# Patient Record
Sex: Male | Born: 1995 | Hispanic: No | Marital: Single | Smoking: Former smoker
Health system: Southern US, Community
[De-identification: ages and names within clinical notes are randomized; demographics above are authoritative.]

## PROBLEM LIST (undated history)

## (undated) DIAGNOSIS — J45909 Unspecified asthma, uncomplicated: Principal | ICD-10-CM

## (undated) HISTORY — DX: Unspecified asthma, uncomplicated: J45.909

---
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
## 2013-04-16 ENCOUNTER — Encounter: Payer: Self-pay | Admitting: Family Medicine

## 2013-04-16 ENCOUNTER — Ambulatory Visit (INDEPENDENT_AMBULATORY_CARE_PROVIDER_SITE_OTHER): Payer: BC Managed Care – PPO | Admitting: Family Medicine

## 2013-04-16 VITALS — BP 151/91 | HR 108 | Temp 98.2°F | Wt 318.0 lb

## 2013-04-16 DIAGNOSIS — B9689 Other specified bacterial agents as the cause of diseases classified elsewhere: Secondary | ICD-10-CM

## 2013-04-16 DIAGNOSIS — J4540 Moderate persistent asthma, uncomplicated: Secondary | ICD-10-CM | POA: Insufficient documentation

## 2013-04-16 DIAGNOSIS — J45909 Unspecified asthma, uncomplicated: Secondary | ICD-10-CM

## 2013-04-16 DIAGNOSIS — A499 Bacterial infection, unspecified: Secondary | ICD-10-CM

## 2013-04-16 DIAGNOSIS — J329 Chronic sinusitis, unspecified: Secondary | ICD-10-CM

## 2013-04-16 MED ORDER — BUDESONIDE-FORMOTEROL FUMARATE 160-4.5 MCG/ACT IN AERO: 2.0000 | INHALATION_SPRAY | Freq: Two times a day (BID) | RESPIRATORY_TRACT | Status: DC

## 2013-04-16 MED ORDER — ALBUTEROL SULFATE HFA 108 (90 BASE) MCG/ACT IN AERS: INHALATION_SPRAY | RESPIRATORY_TRACT | Status: DC

## 2013-04-16 MED ORDER — AMOXICILLIN-POT CLAVULANATE 500-125 MG PO TABS: ORAL_TABLET | ORAL | Status: AC

## 2013-04-16 NOTE — Progress Notes (Signed)
CC: Thomas Salas is a 17 y.o. male is here for Otalgia   Subjective: HPI:  Patient claims of right ear pain described as a sharpness it is nonradiating deep in the ear has been present for 2 weeks accompanied by facial pressure, runny nose, subjective postnasal drip. He believes he's had some mild hearing loss in the right side. Denies discharge from ear, dizziness, tinnitus, headache. Has had subjective fevers and chills over the past 2 days. No interventions as of yet.   He reports that he's been using an old prescription of Symbicort using about 1-2 puffs every one to 2 days. He is currently using it as a rescue inhaler along with albuterol. He reports that he is using albuterol lease once a day symptoms are worse in the evening occasionally awakening due to wheezing. Symptoms are also worse around dogs. Denies chest pain, rash, nor shortness of breath at rest.   Review Of Systems Outlined In HPI  Past Medical History  Diagnosis Date  . Unspecified asthma(493.90) 12/13/2012    Has been Rxed controlled inhaler but not using it.      History reviewed. No pertinent family history.   History  Substance Use Topics  . Smoking status: Never Smoker   . Smokeless tobacco: Not on file  . Alcohol Use: No     Objective: Filed Vitals:   04/16/13 1358  BP: 151/91  Pulse: 108  Temp: 98.2 F (36.8 C)    General: Alert and Oriented, No Acute Distress HEENT: Pupils equal, round, reactive to light. Conjunctivae clear.  External ears unremarkable, canals clear with intact TMs with appropriate landmarks. Middle ear with serous effusion on the right greater than left. Pink inferior turbinates.  Moist mucous membranes, pharynx without inflammation nor lesions.  Neck supple without palpable lymphadenopathy nor abnormal masses. Right tympanogram is essentially flat with low peak centrally, 2 cc volume in the right canal. Rinne test and Weber normal bilaterally. Lungs: Clear to auscultation  bilaterally, no wheezing/ronchi/rales.  Comfortable work of breathing. Good air movement. Extremities: No peripheral edema.  Strong peripheral pulses.  Mental Status: No depression, anxiety, nor agitation. Skin: Warm and dry.  Assessment & Plan: [name] was seen today for otalgia.  Diagnoses and associated orders for this visit:  Bacterial sinusitis - amoxicillin-clavulanate (AUGMENTIN) 500-125 MG per tablet; Take one by mouth every 8 hours for ten total days.  Asthma, moderate persistent  Other Orders - budesonide-formoterol (SYMBICORT) 160-4.5 MCG/ACT inhaler; Inhale 2 puffs into the lungs 2 (two) times daily. - albuterol (PROVENTIL HFA;VENTOLIN HFA) 108 (90 BASE) MCG/ACT inhaler; Inhale two puffs every 4-6 hours only as needed for shortness of breath or wheezing.    Suspect bacterial sinusitis is contributing to eustachian tube dysfunction causing discomfort in the right ear. Start Augmentin. If no improvement by the end of the week will refer to ENT Asthma: Uncontrolled, discussed appropriate use of Symbicort as maintenance inhaler and albuterol for rescue inhaler. Start Symbicort 2 puffs twice a day return 2- 4 weeks for asthma reevaluation.  Return if symptoms worsen or fail to improve.

## 2013-06-04 ENCOUNTER — Telehealth: Payer: Self-pay | Admitting: *Deleted

## 2013-06-04 ENCOUNTER — Encounter: Payer: Self-pay | Admitting: Family Medicine

## 2013-06-04 ENCOUNTER — Ambulatory Visit (INDEPENDENT_AMBULATORY_CARE_PROVIDER_SITE_OTHER): Payer: BC Managed Care – PPO | Admitting: Family Medicine

## 2013-06-04 VITALS — BP 127/80 | HR 86 | Temp 97.8°F | Ht 73.0 in | Wt 316.0 lb

## 2013-06-04 DIAGNOSIS — J45901 Unspecified asthma with (acute) exacerbation: Secondary | ICD-10-CM

## 2013-06-04 MED ORDER — ALBUTEROL SULFATE HFA 108 (90 BASE) MCG/ACT IN AERS: INHALATION_SPRAY | RESPIRATORY_TRACT | Status: DC

## 2013-06-04 MED ORDER — PREDNISONE 20 MG PO TABS: ORAL_TABLET | ORAL | Status: AC

## 2013-06-04 MED ORDER — DOXYCYCLINE HYCLATE 100 MG PO TABS: ORAL_TABLET | ORAL | Status: AC

## 2013-06-04 NOTE — Progress Notes (Signed)
CC: Thomas Salas is a 17 y.o. male is here for Asthma and Emesis   Subjective: HPI:  Patient complains of 2 days of worsening cough, shortness of breath, nonexertional chest tightness. Has been accompanied by subjective fever since yesterday and facial pressure above and below the eyes. Treatment has included using Symbicort twice a day and also a small blue inhaler that he's been using every 2-3 hours. He is unsure exactly what is contained in this inhaler specifically unsure whether or not this is albuterol. Was in his regular state of health prior to onset of symptoms. He does report vomiting this morning yellow fluid without coffee ground emesis. He's had decreased appetite throughout the day.  He does admit to smoking occasionally which worsened symptoms above but nothing else makes better or worse. No other interventions as of yet. Symptoms are present all throughout the day.  Denies confusion, abdominal pain, back pain, orthopnea, peripheral edema, nor motor or sensory disturbances   Review Of Systems Outlined In HPI  Past Medical History  Diagnosis Date  . Unspecified asthma(493.90) 12/13/2012    Has been Rxed controlled inhaler but not using it.      No family history on file.   History  Substance Use Topics  . Smoking status: Never Smoker   . Smokeless tobacco: Not on file  . Alcohol Use: No     Objective: Filed Vitals:   06/04/13 1440  BP: 127/80  Pulse: 86  Temp: 97.8 F (36.6 C)    General: Alert and Oriented, No Acute Distress HEENT: Pupils equal, round, reactive to light. Conjunctivae clear.  External ears unremarkable, canals clear with intact TMs with appropriate landmarks.  Middle ear appears open without effusion. Pink inferior turbinates.  Moist mucous membranes, pharynx without inflammation nor lesions however moderate cobblestoning.  Neck supple without palpable lymphadenopathy nor abnormal masses. Lungs: Mild end expiratory rhonchi and wheezing in all lung  fields, good air movement and comfortable work of breathing without signs of consolidation nor rails Cardiac: Regular rate and rhythm. Normal S1/S2.  No murmurs, rubs, nor gallops.   Abdomen: Obese and soft Extremities: No peripheral edema.  Strong peripheral pulses.  Mental Status: No depression, anxiety, nor agitation. Skin: Warm and dry.  Assessment & Plan: Thomas Salas was seen today for asthma and emesis.  Diagnoses and associated orders for this visit:  Asthma with acute exacerbation - predniSONE (DELTASONE) 20 MG tablet; Three tabs at once daily for five days. - doxycycline (VIBRA-TABS) 100 MG tablet; One by mouth twice a day for ten days. - albuterol (PROVENTIL HFA;VENTOLIN HFA) 108 (90 BASE) MCG/ACT inhaler; Inhale two puffs every 4-6 hours only as needed for shortness of breath or wheezing.  Requesting Ventolin.    Asthma exacerbation: Start doxycycline and prednisone continue Symbicort inhaler, if the "blue inhaler " is not indeed albuterol stop it and instead take up a refill of albuterol given above, 2 puffs every 4-6 hours scheduled for the next 48 hours and as needed. Signs and symptoms requring emergent/urgent reevaluation were discussed with the patient.  25 minutes spent face-to-face during visit today of which at least 50% was counseling or coordinating care regarding asthma exacerbation.   Return in about 2 weeks (around 06/18/2013) for Asthma.

## 2013-06-15 ENCOUNTER — Ambulatory Visit: Payer: BC Managed Care – PPO | Admitting: Family Medicine

## 2013-06-15 DIAGNOSIS — Z0289 Encounter for other administrative examinations: Secondary | ICD-10-CM

## 2013-08-29 ENCOUNTER — Ambulatory Visit (INDEPENDENT_AMBULATORY_CARE_PROVIDER_SITE_OTHER): Payer: BC Managed Care – PPO | Admitting: Family Medicine

## 2013-08-29 ENCOUNTER — Encounter: Payer: Self-pay | Admitting: Family Medicine

## 2013-08-29 VITALS — BP 136/89 | Temp 98.4°F | Ht 73.0 in | Wt 324.0 lb

## 2013-08-29 DIAGNOSIS — G4700 Insomnia, unspecified: Secondary | ICD-10-CM

## 2013-08-29 DIAGNOSIS — J4540 Moderate persistent asthma, uncomplicated: Secondary | ICD-10-CM

## 2013-08-29 DIAGNOSIS — J45901 Unspecified asthma with (acute) exacerbation: Secondary | ICD-10-CM

## 2013-08-29 DIAGNOSIS — J302 Other seasonal allergic rhinitis: Secondary | ICD-10-CM

## 2013-08-29 DIAGNOSIS — J45909 Unspecified asthma, uncomplicated: Secondary | ICD-10-CM

## 2013-08-29 DIAGNOSIS — J309 Allergic rhinitis, unspecified: Secondary | ICD-10-CM

## 2013-08-29 MED ORDER — PREDNISONE 20 MG PO TABS: ORAL_TABLET | ORAL | Status: AC

## 2013-08-29 MED ORDER — ZOLPIDEM TARTRATE 5 MG PO TABS: 5.0000 mg | ORAL_TABLET | Freq: Every evening | ORAL | Status: DC | PRN

## 2013-08-29 MED ORDER — BUDESONIDE-FORMOTEROL FUMARATE 160-4.5 MCG/ACT IN AERO: 2.0000 | INHALATION_SPRAY | Freq: Two times a day (BID) | RESPIRATORY_TRACT | Status: DC

## 2013-08-29 MED ORDER — ALBUTEROL SULFATE HFA 108 (90 BASE) MCG/ACT IN AERS: INHALATION_SPRAY | RESPIRATORY_TRACT | Status: DC

## 2013-08-29 NOTE — Progress Notes (Signed)
CC: Thomas Salas is a 18 y.o. male is here for asthma exacerbation?   Subjective: HPI:  Complains of wheezing and cough that has been present for the last month worsening a weekly basis. Now moderate in severity. Present all hours of the day. He is using either albuterol inhaler or nebulizer up to 4 times a day and this past week once a night. It awakens him from his sleep. Symptoms are improved from albuterol however only for a matter of hours. Has tried Zyrtec as a supplement for his asthma regimen however no improvement.  Symptoms are worse when around smoke or dogs. Denies fevers, chills, confusion, lightheadedness, blood in sputum, facial pressure, nor nasal congestion. He stopped using Symbicort a little over a month ago for reasons that are hard to explain.  Complains of difficulty sleeping even when he feels his asthma is well controlled that has been present since the first of this year. Described as difficulty falling asleep and staying asleep. Happens less than most days of the week. Over-the-counter sleep aids have been losing their effectiveness. Denies depression, anxiety, nor mental disturbance   Review Of Systems Outlined In HPI  Past Medical History  Diagnosis Date  . Unspecified asthma(493.90) 12/13/2012    Has been Rxed controlled inhaler but not using it.     No past surgical history on file. No family history on file.  History   Social History  . Marital Status: Unknown    Spouse Name: N/A    Number of Children: N/A  . Years of Education: N/A   Occupational History  . Not on file.   Social History Main Topics  . Smoking status: Never Smoker   . Smokeless tobacco: Not on file  . Alcohol Use: No  . Drug Use: No  . Sexual Activity: Not Currently   Other Topics Concern  . Not on file   Social History Narrative  . No narrative on file     Objective: BP 136/89  Temp(Src) 98.4 F (36.9 C) (Oral)  Ht 6\' 1"  (1.854 m)  Wt 324 lb (146.965 kg)  BMI 42.76  kg/m2  SpO2 95%  PF 320 L/min  General: Alert and Oriented, No Acute Distress HEENT: Pupils equal, round, reactive to light. Conjunctivae clear.  External ears unremarkable, canals clear with intact TMs with appropriate landmarks.  Middle ear appears open without effusion. Pink inferior turbinates.  Moist mucous membranes, pharynx without inflammation nor lesions.  Neck supple without palpable lymphadenopathy nor abnormal masses. Lungs: Distant breath sounds with trace end expiratory wheezing in all lung lobes. No signs of consolidation. Comfortable work of breathing speaking in long sentences without discomfort. Cardiac: Regular rate and rhythm. Normal S1/S2.  No murmurs, rubs, nor gallops.   Mental Status: No depression, anxiety, nor agitation. Skin: Warm and dry.  Assessment & Plan: Rowe was seen today for asthma exacerbation?.  Diagnoses and associated orders for this visit:  Asthma, moderate persistent - Ambulatory referral to Allergy  Asthma with acute exacerbation - albuterol (PROVENTIL HFA;VENTOLIN HFA) 108 (90 BASE) MCG/ACT inhaler; Inhale two puffs every 4-6 hours only as needed for shortness of breath or wheezing.  Requesting Ventolin. - budesonide-formoterol (SYMBICORT) 160-4.5 MCG/ACT inhaler; Inhale 2 puffs into the lungs 2 (two) times daily. - predniSONE (DELTASONE) 20 MG tablet; Three tabs daily days 1-3, two tabs daily days 4-6, one tab daily days 7-9, half tab daily days 10-13.  Seasonal allergies - Ambulatory referral to Allergy  Insomnia - zolpidem (AMBIEN) 5 MG tablet;  Take 1 tablet (5 mg total) by mouth at bedtime as needed for sleep.    Asthma exacerbation: Continue albuterol, schedule 2 puffs every 4 hours for the next 2 days. Then as needed. Start prednisone taper, samples of Symbicort were provided along with a new prescription. Encouraged use of Symbicort on a daily basis twice a day and washing mouth out afterwards even if he is feeling no asthma symptoms  whatsoever.  He has been lost to follow up to his allergist therefore rereferral has been placed Insomnia: Start Ambien however not until asthma is much better controlled to avoid nocturnal hypoxemia   Return in about 4 weeks (around 09/26/2013).

## 2013-12-13 ENCOUNTER — Encounter: Payer: Self-pay | Admitting: Emergency Medicine

## 2013-12-13 ENCOUNTER — Emergency Department
Admission: EM | Admit: 2013-12-13 | Discharge: 2013-12-13 | Disposition: A | Discharge status: Home / Self Care | Payer: BC Managed Care – PPO | Source: Home / Self Care | Attending: Emergency Medicine | Admitting: Emergency Medicine

## 2013-12-13 DIAGNOSIS — J029 Acute pharyngitis, unspecified: Secondary | ICD-10-CM

## 2013-12-13 DIAGNOSIS — H65199 Other acute nonsuppurative otitis media, unspecified ear: Secondary | ICD-10-CM

## 2013-12-13 DIAGNOSIS — J209 Acute bronchitis, unspecified: Secondary | ICD-10-CM

## 2013-12-13 DIAGNOSIS — H65192 Other acute nonsuppurative otitis media, left ear: Secondary | ICD-10-CM

## 2013-12-13 DIAGNOSIS — J0100 Acute maxillary sinusitis, unspecified: Secondary | ICD-10-CM

## 2013-12-13 LAB — POCT INFLUENZA A/B
Influenza A, POC: NEGATIVE
Influenza B, POC: NEGATIVE

## 2013-12-13 LAB — POCT RAPID STREP A (OFFICE): Rapid Strep A Screen: NEGATIVE

## 2013-12-13 MED ORDER — PROMETHAZINE-CODEINE 6.25-10 MG/5ML PO SYRP: ORAL_SOLUTION | ORAL | Status: DC

## 2013-12-13 MED ORDER — ALBUTEROL SULFATE HFA 108 (90 BASE) MCG/ACT IN AERS: 2.0000 | INHALATION_SPRAY | RESPIRATORY_TRACT | Status: DC | PRN

## 2013-12-13 MED ORDER — AMOXICILLIN-POT CLAVULANATE 875-125 MG PO TABS: 1.0000 | ORAL_TABLET | Freq: Two times a day (BID) | ORAL | Status: DC

## 2013-12-13 MED ORDER — CEFTRIAXONE SODIUM 1 G IJ SOLR
1.0000 g | INTRAMUSCULAR | Status: AC
  Administered 2013-12-13: 1 g via INTRAMUSCULAR

## 2013-12-13 MED ORDER — IPRATROPIUM-ALBUTEROL 0.5-2.5 (3) MG/3ML IN SOLN
3.0000 mL | Freq: Once | RESPIRATORY_TRACT | Status: AC
  Administered 2013-12-13: 3 mL via RESPIRATORY_TRACT

## 2013-12-13 MED ORDER — BUDESONIDE-FORMOTEROL FUMARATE 160-4.5 MCG/ACT IN AERO: 2.0000 | INHALATION_SPRAY | Freq: Two times a day (BID) | RESPIRATORY_TRACT | Status: DC

## 2013-12-13 MED ORDER — PREDNISONE (PAK) 10 MG PO TABS: ORAL_TABLET | ORAL | Status: DC

## 2013-12-13 MED ORDER — METHYLPREDNISOLONE ACETATE 80 MG/ML IJ SUSP
80.0000 mg | Freq: Once | INTRAMUSCULAR | Status: AC
  Administered 2013-12-13: 80 mg via INTRAMUSCULAR

## 2013-12-13 NOTE — ED Notes (Signed)
Reports just returning from cruise to [place] and has had general body aches and congestion, cough, headache, sore throat and a sense of fever for 3 days. No OTCs today.

## 2013-12-13 NOTE — ED Provider Notes (Signed)
CSN: [id number]     Arrival date & time 12/13/13  1446 History   First MD Initiated Contact with Patient 12/13/13 1503     Chief Complaint  Patient presents with  . Generalized Body Aches  . Headache  . Otalgia  . Nasal Congestion  . Cough  . Sore Throat    HPI Reports just returning from cruise to [place] and has had general body aches and congestion, cough, headache, sore throat and chest congestion, wheezing, fever, chills for 3 days.--Symptoms worsening today.   No OTCs today. He tried his albuterol inhaler couple hours ago without significant improvement.   + chills/sweats +  Fever  +  Nasal congestion +  Discolored Post-nasal drainage No sinus pain/pressure + sore throat  +  Cough, occasionally productive + wheezing + chest congestion No hemoptysis + shortness of breath No pleuritic pain No chest pain  No itchy/red eyes + earache L>R  No nausea No vomiting No abdominal pain No diarrhea  No skin rashes. No history of tick bite. +  Fatigue + myalgias + mild nonfocal headache .  No focal neurologic symptoms. No syncope or lightheadedness. Past Medical History  Diagnosis Date  . Unspecified asthma(493.90) 12/13/2012    Has been Rxed controlled inhaler but not using it.    History reviewed. No pertinent past surgical history. History reviewed. No pertinent family history. History  Substance Use Topics  . Smoking status: Never Smoker   . Smokeless tobacco: Not on file  . Alcohol Use: No    Review of Systems  All other systems reviewed and are negative.   Allergies  Review of patient's allergies indicates no known allergies.  Home Medications   Prior to Admission medications   Medication Sig Start Date End Date Taking? Authorizing Provider  albuterol (PROVENTIL HFA;VENTOLIN HFA) 108 (90 BASE) MCG/ACT inhaler Inhale 2 puffs into the lungs every 4 (four) hours as needed for wheezing or shortness of breath. 12/13/13   Lajean Manes, MD   amoxicillin-clavulanate (AUGMENTIN) 875-125 MG per tablet Take 1 tablet by mouth every 12 (twelve) hours. Take for 10 days. Take with food. 12/13/13   Lajean Manes, MD  budesonide-formoterol [facility]) 160-4.5 MCG/ACT inhaler Inhale 2 puffs into the lungs 2 (two) times daily. 12/13/13   Lajean Manes, MD  predniSONE (STERAPRED UNI-PAK) 10 MG tablet Take as directed for 6 days.--Take 6 on day 1, 5 on day 2, 4 on day 3, then 3 tablets on day 4, then 2 tablets on day 5, then 1 on day 6. 12/13/13   Lajean Manes, MD  promethazine-codeine [facility] WITH CODEINE) 6.25-10 MG/5ML syrup Take 1-2 teaspoons every 4-6 hours as needed for cough. May cause drowsiness. 12/13/13   Lajean Manes, MD  zolpidem (AMBIEN) 5 MG tablet Take 1 tablet (5 mg total) by mouth at bedtime as needed for sleep. 08/29/13   Sean Hommel, DO   BP 128/84  Pulse 114  Temp(Src) 98.6 F (37 C) (Oral)  Resp 16  Ht 6\' 1"  (1.854 m)  Wt 330 lb (149.687 kg)  BMI 43.55 kg/m2  SpO2 96% Physical Exam  Nursing note and vitals reviewed. Constitutional: He is oriented to person, place, and time. He appears well-developed and well-nourished. No distress.  Fatigued, alert, cooperative. Appears very ill but not toxic. Occasional hacking cough noted.  HENT:  Head: Normocephalic and atraumatic.  Right Ear: Tympanic membrane, external ear and ear canal normal. No drainage or tenderness.  Left Ear: Ear canal normal. No drainage or tenderness. A  middle ear effusion is present.  Nose: Mucosal edema and rhinorrhea present. Right sinus exhibits maxillary sinus tenderness. Left sinus exhibits maxillary sinus tenderness.  Mouth/Throat: No oral lesions. No oropharyngeal exudate.  Pharynx: One plus red tonsils without exudate. Airway intact. Left TM: Moderately red, mildly bulging and distorted. No perforation. No drainage.  Eyes: Right eye exhibits no discharge. Left eye exhibits no discharge. No scleral icterus.  Neck: Neck supple. No JVD present. No  tracheal deviation present.  Cardiovascular: Normal rate, regular rhythm and normal heart sounds.   Pulmonary/Chest: Effort normal. No stridor. He has wheezes. He has rhonchi. He has no rales.  Diffuse late expiratory wheezing and rhonchi . No rales. Equal expansion . Pulse ox 96% room air  Abdominal: Soft. There is no tenderness.  Lymphadenopathy:    He has no cervical adenopathy.  Neurological: He is alert and oriented to person, place, and time. No cranial nerve deficit. He exhibits normal muscle tone. Coordination normal.  Skin: Skin is warm and dry. No rash noted.  Psychiatric: He has a normal mood and affect.    ED Course  Procedures (including critical care time) Labs Review Labs Reviewed  POCT INFLUENZA A/B  POCT RAPID STREP A (OFFICE)   Results for orders placed during the hospital encounter of 12/13/13  POCT INFLUENZA A/B      Result Value Ref Range   Influenza A, POC Negative     Influenza B, POC Negative    POCT RAPID STREP A (OFFICE)      Result Value Ref Range   Rapid Strep A Screen Negative  Negative     Imaging Review No results found.   MDM   1. Bronchitis, acute, with bronchospasm   2. Acute pharyngitis, unspecified pharyngitis type   3. Acute maxillary sinusitis, recurrence not specified   4. Acute nonsuppurative otitis media of left ear    Rapid strep test negative. Rapid tests for influenza negative .  Treatment options discussed, as well as risks, benefits, alternatives. Patient voiced understanding and agreement with the following plans:  DuoNeb nebulizer treatment given. Wheezing improved. Still had mild late expiratory wheezes.--Improved aeration. Pulse ox on room air improved to 97%. Pulse 96, Regular.  Antibiotic coverage:  Rocephin 1 g IM stat Augmentin 875 twice a day x10 days  Depo-Medrol 80 mg IM stat Prednisone 10 mg-6 day Dosepak  I refilled Symbicort, 2 inhalations twice a day. He admits that he had run out and was not using  regularly. I encouraged him to use this regularly. Refill albuterol HFA rescue inhaler.  Promethazine with codeine, 4 ounces. No refills. One or 2 teaspoons every 6 hours when necessary cough, but precautions discussed.  Other symptomatic care discussed. Explained the importance of controlling his asthma and following up regularly with PCP.  Follow-up with your primary care doctor in 7 days , or sooner if symptoms become worse. Precautions discussed. Red flags discussed. Questions invited and answered. Patient voiced understanding and agreement.      Lajean Manesavid Massey, MD 12/13/13 [phone]

## 2014-04-24 ENCOUNTER — Telehealth: Payer: Self-pay | Admitting: *Deleted

## 2014-04-24 MED ORDER — ALBUTEROL SULFATE HFA 108 (90 BASE) MCG/ACT IN AERS: 2.0000 | INHALATION_SPRAY | RESPIRATORY_TRACT | Status: DC | PRN

## 2014-04-24 NOTE — Telephone Encounter (Signed)
Pt called in for refill of albuterol. I checked his last OV and informed him that he was to have f/u W/ Dr. Ivan AnchorsHommel on 09/26/13 for asthma. I told him that I would send a refill for his albuterol and stressed the importance of him following up with Dr. Ivan AnchorsHommel. He is off on Friday appt made for 04/26/14 @815 . He voiced understanding .Loralee PacasBarkley, Vennela Jutte [place]\

## 2014-04-26 ENCOUNTER — Ambulatory Visit (INDEPENDENT_AMBULATORY_CARE_PROVIDER_SITE_OTHER): Payer: BC Managed Care – PPO | Admitting: Family Medicine

## 2014-04-26 ENCOUNTER — Encounter: Payer: Self-pay | Admitting: Family Medicine

## 2014-04-26 VITALS — BP 132/76 | HR 61 | Ht 74.0 in | Wt 341.0 lb

## 2014-04-26 DIAGNOSIS — J4540 Moderate persistent asthma, uncomplicated: Secondary | ICD-10-CM | POA: Diagnosis not present

## 2014-04-26 MED ORDER — BUDESONIDE-FORMOTEROL FUMARATE 160-4.5 MCG/ACT IN AERO: 2.0000 | INHALATION_SPRAY | Freq: Two times a day (BID) | RESPIRATORY_TRACT | Status: DC

## 2014-04-26 MED ORDER — MONTELUKAST SODIUM 10 MG PO TABS: 10.0000 mg | ORAL_TABLET | Freq: Every day | ORAL | Status: DC

## 2014-04-26 MED ORDER — ALBUTEROL SULFATE HFA 108 (90 BASE) MCG/ACT IN AERS: 2.0000 | INHALATION_SPRAY | RESPIRATORY_TRACT | Status: DC | PRN

## 2014-04-26 NOTE — Progress Notes (Signed)
CC: Thomas Salas is a 18 y.o. male is here for Follow-up   Subjective: HPI:   Has been using albuterol pretty much every day of the week. Symptoms are worse first thing in the morning. It provides temporary relief of moderate wheezing and shortness of breath. He's been using a dry powder form of this but feels that the aerosol based his most effective. He's also been using Symbicort twice a day most days of the week. He is trying to stretch out his supply of Symbicort and has been using family members supplies. Symptoms are worse around dust and when outside. He feels like shortness of breath and wheezing has been worsening over the past 1-2 weeks. He denies fevers, chills, chest pain, motor or sensory disturbances, sore throat, postnasal drip, nor nasal congestion.   Review Of Systems Outlined In HPI  Past Medical History  Diagnosis Date  . Unspecified asthma(493.90) 12/13/2012    Has been Rxed controlled inhaler but not using it.     No past surgical history on file. No family history on file.  History   Social History  . Marital Status: Unknown    Spouse Name: N/A    Number of Children: N/A  . Years of Education: N/A   Occupational History  . Not on file.   Social History Main Topics  . Smoking status: Never Smoker   . Smokeless tobacco: Not on file  . Alcohol Use: No  . Drug Use: No  . Sexual Activity: Not Currently   Other Topics Concern  . Not on file   Social History Narrative     Objective: BP 132/76 mmHg  Pulse 61  Ht 6\' 2"  (1.88 m)  Wt 341 lb (154.677 kg)  BMI 43.76 kg/m2  General: Alert and Oriented, No Acute Distress HEENT: Pupils equal, round, reactive to light. Conjunctivae clear.  External ears unremarkable, canals clear with intact TMs with appropriate landmarks.  Middle ear appears open without effusion. Pink inferior turbinates.  Moist mucous membranes, pharynx without inflammation nor lesions.  Neck supple without palpable lymphadenopathy nor  abnormal masses. Lungs: comfortable work of breathing with mild and expert for a wheezing in all lung fields without rhonchi nor rales. Cardiac: Regular rate and rhythm. Normal S1/S2.  No murmurs, rubs, nor gallops.   Extremities: No peripheral edema.  Strong peripheral pulses.  Mental Status: No depression, anxiety, nor agitation. Skin: Warm and dry.  Assessment & Plan: [name] was seen today for follow-up.  Diagnoses and associated orders for this visit:  Asthma, moderate persistent, uncomplicated - montelukast (SINGULAIR) 10 MG tablet; Take 1 tablet (10 mg total) by mouth at bedtime. - budesonide-formoterol (SYMBICORT) 160-4.5 MCG/ACT inhaler; Inhale 2 puffs into the lungs 2 (two) times daily. - albuterol (PROVENTIL HFA;VENTOLIN HFA) 108 (90 BASE) MCG/ACT inhaler; Inhale 2 puffs into the lungs every 4 (four) hours as needed for wheezing or shortness of breath.  Other Orders - Discontinue: budesonide-formoterol (SYMBICORT) 160-4.5 MCG/ACT inhaler; Inhale 2 puffs into the lungs 2 (two) times daily.    Asthma: Uncontrolled chronic condition, refills provided for Symbicort and albuterol. I've asked him to restart Singulair since this provided considerable benefit in the past. Time was taken to counsel on tobacco cessation.  25 minutes spent face-to-face during visit today of which at least 50% was counseling or coordinating care regarding: 1. Asthma, moderate persistent, uncomplicated      Return in about 4 weeks (around 05/24/2014) for Asthma Recheck.

## 2014-06-14 ENCOUNTER — Ambulatory Visit (INDEPENDENT_AMBULATORY_CARE_PROVIDER_SITE_OTHER): Payer: BLUE CROSS/BLUE SHIELD | Admitting: Physician Assistant

## 2014-06-14 ENCOUNTER — Encounter: Payer: Self-pay | Admitting: Physician Assistant

## 2014-06-14 VITALS — BP 137/84 | HR 88 | Ht 74.0 in | Wt 330.0 lb

## 2014-06-14 DIAGNOSIS — H66001 Acute suppurative otitis media without spontaneous rupture of ear drum, right ear: Secondary | ICD-10-CM

## 2014-06-14 DIAGNOSIS — J209 Acute bronchitis, unspecified: Secondary | ICD-10-CM | POA: Diagnosis not present

## 2014-06-14 DIAGNOSIS — J45901 Unspecified asthma with (acute) exacerbation: Secondary | ICD-10-CM

## 2014-06-14 MED ORDER — HYDROCODONE-HOMATROPINE 5-1.5 MG/5ML PO SYRP: 5.0000 mL | ORAL_SOLUTION | Freq: Every evening | ORAL | Status: DC | PRN

## 2014-06-14 MED ORDER — PREDNISONE 50 MG PO TABS: ORAL_TABLET | ORAL | Status: DC

## 2014-06-14 MED ORDER — AZITHROMYCIN 250 MG PO TABS
ORAL_TABLET | ORAL | Status: DC
Start: 2014-06-14 — End: 2014-09-02

## 2014-06-14 NOTE — Patient Instructions (Signed)

## 2014-06-14 NOTE — Progress Notes (Signed)
   Subjective:    Patient ID: Thomas Salas, male    DOB: 07/13/1995, 19 y.o.   MRN: [id number]  HPI  Pt presents to the clinic with 5 days of cough, wheezing, ST, headache, right ear pain and body aches. No fever. Cough is productive with yellow/green sputum. Hx of asthma. Wheezing is worsening using albuterol inhaler 3-4 times a day. theraflu not helping. Hot tea does soothe the throat.     Review of Systems  All other systems reviewed and are negative.      Objective:   Physical Exam  Constitutional: He is oriented to person, place, and time. He appears well-developed and well-nourished.  HENT:  Head: Normocephalic and atraumatic.  Left Ear: External ear normal.  Right TM serous, erythematous dull. No active drainage or perforation.   Oropharynx erythematous with no tonsillar swelling or exudate.   Nasal turbinates red and swollen.   Eyes: Conjunctivae are normal. Right eye exhibits no discharge. Left eye exhibits no discharge.  Neck: Normal range of motion. Neck supple.  Cardiovascular: Normal rate, regular rhythm and normal heart sounds.   Pulmonary/Chest:  Expiratory wheezing, bilaterally.    Lymphadenopathy:    He has no cervical adenopathy.  Neurological: He is alert and oriented to person, place, and time.  Skin: Skin is dry.  Psychiatric: He has a normal mood and affect. His behavior is normal.          Assessment & Plan:  Right otitis media/acute bronchitis with asthma exacerbation- zpak given today with prednisone for 5 days. Hycodan for cough only at bedtime. Encouraged to continue to use rescue inhaler as needed. Continue ibuprofen and theraflu. Rest and hydrate. Call if worsening or symptoms changing.   Pt given sample of symbicort at his request. He is using bid.

## 2014-06-19 ENCOUNTER — Telehealth: Payer: Self-pay

## 2014-06-19 MED ORDER — HYDROCODONE-HOMATROPINE 5-1.5 MG/5ML PO SYRP: 5.0000 mL | ORAL_SOLUTION | Freq: Every evening | ORAL | Status: DC | PRN
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
## 2014-06-19 NOTE — Telephone Encounter (Signed)
Patient advised to pick up  

## 2014-06-19 NOTE — Telephone Encounter (Signed)
Ok for refill hycodan. If not improving needs to come in for recheck

## 2014-09-02 ENCOUNTER — Emergency Department
Admission: EM | Admit: 2014-09-02 | Discharge: 2014-09-02 | Disposition: A | Discharge status: Home / Self Care | Payer: BLUE CROSS/BLUE SHIELD | Source: Home / Self Care | Attending: Family Medicine | Admitting: Family Medicine

## 2014-09-02 ENCOUNTER — Encounter: Payer: Self-pay | Admitting: *Deleted

## 2014-09-02 ENCOUNTER — Ambulatory Visit: Payer: Self-pay | Admitting: Family Medicine

## 2014-09-02 DIAGNOSIS — J209 Acute bronchitis, unspecified: Secondary | ICD-10-CM

## 2014-09-02 MED ORDER — GUAIFENESIN-CODEINE 100-10 MG/5ML PO SOLN: ORAL | Status: DC

## 2014-09-02 MED ORDER — AZITHROMYCIN 250 MG PO TABS: ORAL_TABLET | ORAL | Status: DC

## 2014-09-02 MED ORDER — PREDNISONE 50 MG PO TABS
ORAL_TABLET | ORAL | Status: DC
Start: 2014-09-02 — End: 2014-10-11

## 2014-09-02 MED ORDER — BENZONATATE 200 MG PO CAPS: 200.0000 mg | ORAL_CAPSULE | Freq: Every day | ORAL | Status: DC

## 2014-09-02 NOTE — Discharge Instructions (Signed)
Take plain guaifenesin (1200mg  extended release tabs such as Mucinex) twice daily, with plenty of water, for cough and congestion.  May add Pseudoephedrine (30mg , one or two every 4 to 6 hours) for sinus congestion.  Get adequate rest.   May use Afrin nasal spray (or generic oxymetazoline) twice daily for about 5 days.  Also recommend using saline nasal spray several times daily and saline nasal irrigation (AYR is a common brand).  Try warm salt water gargles for sore throat.  Stop all antihistamines for now, and other non-prescription cough/cold preparations. Continue albuterol inhaler and albuterol by nebulizer as needed.  Continue Symbicort. Follow-up with family doctor if not improving about one week.

## 2014-09-02 NOTE — ED Notes (Signed)
Thomas LemmingVictor c/o 3 days of cough, congestion and sweats. Also c/o neck and back pain.

## 2014-09-02 NOTE — ED Provider Notes (Signed)
CSN: [id number]     Arrival date & time 09/02/14  1435 History   First MD Initiated Contact with Patient 09/02/14 1524     Chief Complaint  Patient presents with  . Cough  . Night Sweats      HPI Comments: Patient complains of three day history of typical cold-like symptoms including mild sore throat, sinus congestion, headache, fatigue, chills/sweats, and cough.  He has a history of asthma, controlled with Symbicort BID, and albuterol by nebulizer PRN.  Over the past two days he has developed increased wheezing and shortness of breath with activity.  The history is provided by the patient.    Past Medical History  Diagnosis Date  . Unspecified asthma(493.90) 12/13/2012    Has been Rxed controlled inhaler but not using it.    History reviewed. No pertinent past surgical history. History reviewed. No pertinent family history. History  Substance Use Topics  . Smoking status: Never Smoker   . Smokeless tobacco: Never Used  . Alcohol Use: No    Review of Systems No sore throat + cough No pleuritic pain + wheezing + nasal congestion + post-nasal drainage No sinus pain/pressure No itchy/red eyes No earache No hemoptysis + SOB with activitiy No fever, + chills/sweats No nausea No vomiting No abdominal pain No diarrhea No urinary symptoms No skin rash + fatigue + myalgias + headache Used OTC meds without relief  Allergies  Review of patient's allergies indicates no known allergies.  Home Medications   Prior to Admission medications   Medication Sig Start Date End Date Taking? Authorizing Provider  albuterol (PROVENTIL HFA;VENTOLIN HFA) 108 (90 BASE) MCG/ACT inhaler Inhale 2 puffs into the lungs every 4 (four) hours as needed for wheezing or shortness of breath. 04/26/14   Sean Hommel, DO  azithromycin (ZITHROMAX Z-PAK) 250 MG tablet Take 2 tabs today; then begin one tab once daily for 4 more days. 09/02/14   Lattie Haw, MD  benzonatate (TESSALON) 200 MG capsule  Take 1 capsule (200 mg total) by mouth at bedtime. Take as needed for cough 09/02/14   Lattie Haw, MD  budesonide-formoterol [facility]) 160-4.5 MCG/ACT inhaler Inhale 2 puffs into the lungs 2 (two) times daily. 04/26/14   Sean Hommel, DO  montelukast (SINGULAIR) 10 MG tablet Take 1 tablet (10 mg total) by mouth at bedtime. 04/26/14   Laren Boom, DO  predniSONE (DELTASONE) 50 MG tablet Take one tab by mouth with food once daily for five days 09/02/14   Lattie Haw, MD  zolpidem (AMBIEN) 5 MG tablet Take 1 tablet (5 mg total) by mouth at bedtime as needed for sleep. 08/29/13   Sean Hommel, DO   BP 141/82 mmHg  Pulse 90  Temp(Src) 98.1 F (36.7 C) (Oral)  Resp 18  Wt 336 lb (152.409 kg)  SpO2 96% Physical Exam Nursing notes and Vital Signs reviewed. Appearance:  Patient appears stated age, and in no acute distress Eyes:  Pupils are equal, round, and reactive to light and accomodation.  Extraocular movement is intact.  Conjunctivae are not inflamed  Ears:  Canals normal.  Tympanic membranes normal.  Nose:  Mildly congested turbinates.  No sinus tenderness.   Pharynx:  Normal Neck:  Supple.  Slightly tender posterior nodes are palpated bilaterally  Lungs:  Clear to auscultation.  Breath sounds are equal.  Heart:  Regular rate and rhythm without murmurs, rubs, or gallops.  Abdomen:  Nontender without masses or hepatosplenomegaly.  Bowel sounds are present.  No CVA  or flank tenderness.  Extremities:  No edema.  No calf tenderness Skin:  No rash present.   ED Course  Procedures  none  MDM   1. Bronchitis, acute, with bronchospasm    Begin prednisone burst, and z-pack for atypical coverage.  Prescription written for Benzonatate [facility](Tessalon) to take at bedtime for night-time cough.  Take plain guaifenesin (1200mg  extended release tabs such as Mucinex) twice daily, with plenty of water, for cough and congestion.  May add Pseudoephedrine (30mg , one or two every 4 to 6 hours) for sinus  congestion.  Get adequate rest.   May use Afrin nasal spray (or generic oxymetazoline) twice daily for about 5 days.  Also recommend using saline nasal spray several times daily and saline nasal irrigation (AYR is a common brand).  Try warm salt water gargles for sore throat.  Stop all antihistamines for now, and other non-prescription cough/cold preparations. Continue albuterol inhaler and albuterol by nebulizer as needed.  Continue Symbicort. Follow-up with family doctor if not improving about one week.    Lattie HawStephen A Beese, MD 09/07/14 [phone]

## 2014-10-11 ENCOUNTER — Encounter: Payer: Self-pay | Admitting: Family Medicine

## 2014-10-11 ENCOUNTER — Ambulatory Visit (INDEPENDENT_AMBULATORY_CARE_PROVIDER_SITE_OTHER): Payer: BLUE CROSS/BLUE SHIELD | Admitting: Family Medicine

## 2014-10-11 VITALS — BP 134/91 | HR 93 | Temp 97.4°F | Wt 343.0 lb

## 2014-10-11 DIAGNOSIS — J4540 Moderate persistent asthma, uncomplicated: Secondary | ICD-10-CM

## 2014-10-11 DIAGNOSIS — M542 Cervicalgia: Secondary | ICD-10-CM

## 2014-10-11 MED ORDER — MONTELUKAST SODIUM 10 MG PO TABS: 10.0000 mg | ORAL_TABLET | Freq: Every day | ORAL | Status: DC

## 2014-10-11 MED ORDER — FLUTICASONE FUROATE-VILANTEROL 100-25 MCG/INH IN AEPB
INHALATION_SPRAY | RESPIRATORY_TRACT | Status: DC
Start: 2014-10-11 — End: 2015-04-14

## 2014-10-11 MED ORDER — METHOCARBAMOL 500 MG PO TABS: 500.0000 mg | ORAL_TABLET | Freq: Four times a day (QID) | ORAL | Status: DC | PRN

## 2014-10-11 NOTE — Progress Notes (Signed)
CC: Thomas Salas is a 19 y.o. male is here for Cough and Neck Pain   Subjective: HPI:  Complains of awakening every night with cough, wheezing and needing to use his nebulizer to go back to bed.  Symptoms moderate in severity at night and mild in the morning.  Albuterol always helps but only for a few hours.  Symptoms worse in the sprig.  Taking singulair nightly but symbicort only a few times a week.  No fevers, chills, chest pain, confusion, nasal congestion, nor rash.  Posterior neck pain for the past four weeks. Only gets releif from chiropractic treatments, this only lasts for a few hours.  Pain is described as a tightness lateral to the midline of tne neck.  Worse with lifting heavy objects.  Nonradiating.  No shoulder pain.  Denies recent injury.  No overlying skin changes.  No midline neck pain nor motor/sensory disturbances in the upper extremities.     Review Of Systems Outlined In HPI  Past Medical History  Diagnosis Date  . Unspecified asthma(493.90) 12/13/2012    Has been Rxed controlled inhaler but not using it.     No past surgical history on file. No family history on file.  History   Social History  . Marital Status: Unknown    Spouse Name: N/A  . Number of Children: N/A  . Years of Education: N/A   Occupational History  . Not on file.   Social History Main Topics  . Smoking status: Never Smoker   . Smokeless tobacco: Never Used  . Alcohol Use: No  . Drug Use: No  . Sexual Activity: Not Currently   Other Topics Concern  . Not on file   Social History Narrative     Objective: BP 134/91 mmHg  Pulse 93  Temp(Src) 97.4 F (36.3 C) (Oral)  Wt 343 lb (155.584 kg)  SpO2 95%  General: Alert and Oriented, No Acute Distress HEENT: Pupils equal, round, reactive to light. Conjunctivae clear.  Moist mucous membranes Lungs: Comfortable work of breathing with mild wheezing in right posterior lung field.  No ronchi or rales. Cardiac: Regular rate and rhythm.  Normal S1/S2.  No murmurs, rubs, nor gallops.   Abdomen: obese soft Extremities: No peripheral edema.  Strong peripheral pulses.  Neck: No midline spinous process tenderness, full ROM and strength.  Pain reproduced with palpation of mildly hypertonic upper trap muscles.  Mental Status: No depression, anxiety, nor agitation. Skin: Warm and dry.  Assessment & Plan: [name] was seen today for cough and neck pain.  Diagnoses and all orders for this visit:  Asthma, moderate persistent, uncomplicated Orders: -     Fluticasone Furoate-Vilanterol 100-25 MCG/INH AEPB; One inhalation daily to prevent wheezing, cough, or shortness of breath. -     montelukast (SINGULAIR) 10 MG tablet; Take 1 tablet (10 mg total) by mouth at bedtime.  Neck pain  Other orders -     methocarbamol (ROBAXIN) 500 MG tablet; Take 1 tablet (500 mg total) by mouth every 6 (six) hours as needed for muscle spasms.   Asthma: uncontrolled chronic condition. Reinforced that symbicort, if taken BID every day, would likely prevent his attacks during the day and at night.  Due to compliance issues switching to once a day formulation using Breo.  Encouraged to use daily even if feeling "great" and continue PRN albuterol. Neck pain: Trial of Robaxin, return PRN for this   Return in about 3 months (around 01/11/2015) for Asthma.

## 2014-11-21 ENCOUNTER — Encounter: Payer: Self-pay | Admitting: Emergency Medicine

## 2014-11-21 ENCOUNTER — Emergency Department
Admission: EM | Admit: 2014-11-21 | Discharge: 2014-11-21 | Disposition: A | Discharge status: Home / Self Care | Payer: BLUE CROSS/BLUE SHIELD | Source: Home / Self Care | Attending: Emergency Medicine | Admitting: Emergency Medicine

## 2014-11-21 DIAGNOSIS — S161XXA Strain of muscle, fascia and tendon at neck level, initial encounter: Secondary | ICD-10-CM

## 2014-11-21 MED ORDER — CARISOPRODOL 350 MG PO TABS: ORAL_TABLET | ORAL | Status: DC

## 2014-11-21 MED ORDER — ALPRAZOLAM 0.5 MG PO TABS
0.5000 mg | ORAL_TABLET | Freq: Two times a day (BID) | ORAL | Status: DC | PRN
Start: 2014-11-21 — End: 2015-04-14

## 2014-11-21 MED ORDER — MELOXICAM 15 MG PO TABS: 15.0000 mg | ORAL_TABLET | Freq: Every day | ORAL | Status: DC
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
## 2014-11-21 NOTE — Discharge Instructions (Signed)
Cervical Sprain A cervical sprain is when the tissues (ligaments) that hold the neck bones in place stretch or tear. HOME CARE   Put ice on the injured area.  Put ice in a plastic bag.  Place a towel between your skin and the bag.  Leave the ice on for 15-20 minutes, 3-4 times a day.  Only take medicine as told by your doctor.  Keep all doctor visits as told.  Keep all physical therapy visits as told.  Adjust your work station so that you have good posture while you work.  Avoid positions and activities that make your problems worse.  Warm up and stretch before being active. GET HELP IF:  Your pain is not controlled with medicine.  You cannot take less pain medicine over time as planned.  Your activity level does not improve as expected. GET HELP RIGHT AWAY IF:   You are bleeding.  Your stomach is upset.  You have an allergic reaction to your medicine.  You develop new problems that you cannot explain.  You lose feeling (become numb) or you cannot move any part of your body (paralysis).  You have tingling or weakness in any part of your body.  Your symptoms get worse. Symptoms include:  Pain, soreness, stiffness, puffiness (swelling), or a burning feeling in your neck.  Pain when your neck is touched.  Shoulder or upper back pain.  Limited ability to move your neck.  Headache.  Dizziness.  Your hands or arms feel week, lose feeling, or tingle.  Muscle spasms.  Difficulty swallowing or chewing. MAKE SURE YOU:   Understand these instructions.  Will watch your condition.  Will get help right away if you are not doing well or get worse. Document Released: 11/10/2007 Document Revised: 01/24/2013 Document Reviewed: 11/29/2012 [facility] Patient Information 2015 [place], [place]. This information is not intended to replace advice given to you by your health care provider. Make sure you discuss any questions you have with your health care  provider.  Follow-up with your primary care physician within 7 days. We cannot refill any of the medications prescribed today.
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
## 2015-04-14 ENCOUNTER — Encounter: Payer: Self-pay | Admitting: Family Medicine

## 2015-04-14 ENCOUNTER — Ambulatory Visit (INDEPENDENT_AMBULATORY_CARE_PROVIDER_SITE_OTHER): Payer: BLUE CROSS/BLUE SHIELD | Admitting: Family Medicine

## 2015-04-14 VITALS — BP 137/84 | HR 108 | Temp 97.7°F | Wt 312.0 lb

## 2015-04-14 DIAGNOSIS — F411 Generalized anxiety disorder: Secondary | ICD-10-CM | POA: Diagnosis not present

## 2015-04-14 DIAGNOSIS — J4540 Moderate persistent asthma, uncomplicated: Secondary | ICD-10-CM | POA: Diagnosis not present

## 2015-04-14 DIAGNOSIS — J4551 Severe persistent asthma with (acute) exacerbation: Secondary | ICD-10-CM | POA: Diagnosis not present

## 2015-04-14 MED ORDER — SODIUM CHLORIDE 0.9 % IV SOLN
125.0000 mg | Freq: Once | INTRAVENOUS | Status: AC
  Administered 2015-04-14: 130 mg via INTRAMUSCULAR

## 2015-04-14 MED ORDER — METHYLPREDNISOLONE SODIUM SUCC 125 MG IJ SOLR: 125.0000 mg | Freq: Once | INTRAMUSCULAR | Status: DC

## 2015-04-14 MED ORDER — FLUTICASONE FUROATE-VILANTEROL 100-25 MCG/INH IN AEPB: INHALATION_SPRAY | RESPIRATORY_TRACT | Status: AC

## 2015-04-14 MED ORDER — PREDNISONE 20 MG PO TABS: ORAL_TABLET | ORAL | Status: AC

## 2015-04-14 MED ORDER — ALPRAZOLAM 0.5 MG PO TABS: 0.5000 mg | ORAL_TABLET | Freq: Two times a day (BID) | ORAL | Status: DC | PRN

## 2015-04-14 MED ORDER — AZITHROMYCIN 250 MG PO TABS: ORAL_TABLET | ORAL | Status: AC

## 2015-04-14 NOTE — Progress Notes (Signed)
CC: Thomas Salas is a 19 y.o. male is here for URI   Subjective: HPI:   1-1/2 weeks of  Shortness of breath, wheezing, nasal congestion and cough. Cough is nonproductive. Symptoms are slowly worsening and no longer responding to albuterol. He stopped using his controller inhaler a few weeks ago. Symptoms have been bothering him both while awake and giving him nocturnal symptoms waking him up at night. Nothing seems to make symptoms better or worse right now. They're moderate in severity. He denies exertional chest pain, orthopnea, peripheral edema, fevers or chills.  His grandmother recently unexpectedly passed away which is worsening his anxiety. He's not really sure what he is anxious about,takes usually about something bad happening or getting in trouble at work. Xanax was helping with this but he ran out of it.   Review Of Systems Outlined In HPI  Past Medical History  Diagnosis Date  . Unspecified asthma(493.90) 12/13/2012    Has been Rxed controlled inhaler but not using it.     No past surgical history on file. No family history on file.  Social History   Social History  . Marital Status: Unknown    Spouse Name: N/A  . Number of Children: N/A  . Years of Education: N/A   Occupational History  . Not on file.   Social History Main Topics  . Smoking status: Current Every Day Smoker -- 0.50 packs/day    Types: Cigarettes  . Smokeless tobacco: Never Used  . Alcohol Use: Yes  . Drug Use: No  . Sexual Activity: Not Currently   Other Topics Concern  . Not on file   Social History Narrative     Objective: BP 137/84 mmHg  Pulse 108  Temp(Src) 97.7 F (36.5 C) (Oral)  Wt 312 lb (141.522 kg)  SpO2 92%  General: Alert and Oriented, No Acute Distress HEENT: Pupils equal, round, reactive to light. Conjunctivae clear.  External ears unremarkable, canals clear with intact TMs with appropriate landmarks.  Middle ear appears open without effusion. Pink inferior turbinates.   Moist mucous membranes, pharynx without inflammation nor lesions.  Neck supple without palpable lymphadenopathy nor abnormal masses. Lungs: comfortable work of breathing with diffuse end expiratory wheezing in all lung fields to a mild degree. No rhonchi or rales Cardiac: Regular rate and rhythm. Normal S1/S2.  No murmurs, rubs, nor gallops.   Extremities: No peripheral edema.  Strong peripheral pulses.  Mental Status: No depression, anxiety, nor agitation. Skin: Warm and dry.  Assessment & Plan: Thomas Salas was seen today for uri.  Diagnoses and all orders for this visit:  Generalized anxiety disorder  Asthma, moderate persistent, uncomplicated -     Fluticasone Furoate-Vilanterol 100-25 MCG/INH AEPB; One inhalation daily to prevent wheezing, cough, or shortness of breath.  Asthma with exacerbation, severe persistent  Other orders -     predniSONE (DELTASONE) 20 MG tablet; Three tabs daily days 1-3, two tabs daily days 4-6, one tab daily days 7-9, half tab daily days 10-13. -     azithromycin (ZITHROMAX) 250 MG tablet; Take two tabs at once on day 1, then one tab daily on days 2-5. -     ALPRAZolam (XANAX) 0.5 MG tablet; Take 1 tablet (0.5 mg total) by mouth 2 (two) times daily as needed for anxiety.   Anxiety: Refilled Xanax to be used as sparingly as possible. Asthma: Uncontrolled chronic condition, he needs to restart breo, to get out of this exacerbation I given him a prednisone taper along with azithromycin,  he will also get a Solu-Medrol injection here in the office before leaving   Return if symptoms worsen or fail to improve.

## 2015-05-26 ENCOUNTER — Other Ambulatory Visit: Payer: Self-pay

## 2015-05-26 ENCOUNTER — Telehealth: Payer: Self-pay

## 2015-05-26 DIAGNOSIS — J4540 Moderate persistent asthma, uncomplicated: Secondary | ICD-10-CM

## 2015-05-26 MED ORDER — ALPRAZOLAM 0.5 MG PO TABS: 0.5000 mg | ORAL_TABLET | Freq: Two times a day (BID) | ORAL | Status: DC | PRN

## 2015-05-26 MED ORDER — ALBUTEROL SULFATE HFA 108 (90 BASE) MCG/ACT IN AERS: 2.0000 | INHALATION_SPRAY | RESPIRATORY_TRACT | Status: DC | PRN

## 2015-05-26 NOTE — Telephone Encounter (Signed)
Patient called requesting xanax.  Rx sent to [facility] pharmacy.  Patient informed that he needs to be seen for further refills since he is taking this medication more frequently.

## 2015-09-07 ENCOUNTER — Other Ambulatory Visit: Payer: Self-pay | Admitting: Family Medicine

## 2015-10-20 ENCOUNTER — Encounter: Payer: Self-pay | Admitting: Family Medicine

## 2015-10-20 ENCOUNTER — Ambulatory Visit (INDEPENDENT_AMBULATORY_CARE_PROVIDER_SITE_OTHER): Payer: BLUE CROSS/BLUE SHIELD | Admitting: Family Medicine

## 2015-10-20 VITALS — BP 129/86 | HR 109 | Wt 319.0 lb

## 2015-10-20 DIAGNOSIS — B029 Zoster without complications: Secondary | ICD-10-CM | POA: Diagnosis not present

## 2015-10-20 DIAGNOSIS — J4540 Moderate persistent asthma, uncomplicated: Secondary | ICD-10-CM | POA: Diagnosis not present

## 2015-10-20 DIAGNOSIS — F411 Generalized anxiety disorder: Secondary | ICD-10-CM | POA: Diagnosis not present

## 2015-10-20 MED ORDER — METHYLPREDNISOLONE ACETATE 80 MG/ML IJ SUSP
80.0000 mg | Freq: Once | INTRAMUSCULAR | Status: AC
  Administered 2015-10-20: 80 mg via INTRAMUSCULAR

## 2015-10-20 MED ORDER — GABAPENTIN 300 MG PO CAPS: ORAL_CAPSULE | ORAL | Status: DC

## 2015-10-20 MED ORDER — MONTELUKAST SODIUM 10 MG PO TABS: 10.0000 mg | ORAL_TABLET | Freq: Every day | ORAL | Status: DC

## 2015-10-20 MED ORDER — CLONAZEPAM 0.5 MG PO TABS: 0.5000 mg | ORAL_TABLET | Freq: Two times a day (BID) | ORAL | Status: DC | PRN

## 2015-10-20 MED ORDER — VALACYCLOVIR HCL 1 G PO TABS: 1000.0000 mg | ORAL_TABLET | Freq: Three times a day (TID) | ORAL | Status: DC

## 2015-10-20 NOTE — Addendum Note (Signed)
Addended by: Thom ChimesHENRY, Craig Ionescu M on: 10/20/2015 02:37 PM   Modules accepted: Orders

## 2015-10-20 NOTE — Progress Notes (Signed)
CC: Thomas Salas is a 20 y.o. male is here for Rash   Subjective: HPI:  Follow-up asthma: He ran out of Singulair a few months ago and symptoms of wheezing cough and shortness of breath returned. Currently present with moderate degree at rest and with exertion. He denies any chest discomfort. He is using his albuterol inhaler on a daily basis.  Follow-up anxiety: He tells me that he doesn't think that alprazolam is helping calm his nerves. He tells me his anxiety has worsened ever since his grandma passed away unexpectedly. He tells me he gets nervous on an almost daily basis. He denies any depression or other mental disturbance.  His major complaint today is a blistering rash that was noticed yesterday. It's localized on the right flank and goes into the right groin. It was preceded by some pain last week however the pain has persisted and is worse to the touch. He denies fevers, chills, nor swollen lymph nodes. No interventions as of yet.   Review Of Systems Outlined In HPI  Past Medical History  Diagnosis Date  . Unspecified asthma(493.90) 12/13/2012    Has been Rxed controlled inhaler but not using it.     No past surgical history on file. No family history on file.  Social History   Social History  . Marital Status: Unknown    Spouse Name: N/A  . Number of Children: N/A  . Years of Education: N/A   Occupational History  . Not on file.   Social History Main Topics  . Smoking status: Current Every Day Smoker -- 0.50 packs/day    Types: Cigarettes  . Smokeless tobacco: Never Used  . Alcohol Use: Yes  . Drug Use: No  . Sexual Activity: Not Currently   Other Topics Concern  . Not on file   Social History Narrative     Objective: BP 129/86 mmHg  Pulse 109  Wt 319 lb (144.697 kg)  General: Alert and Oriented, No Acute Distress HEENT: Pupils equal, round, reactive to light. Conjunctivae clear. Moist mucous membranes Lungs: Clear to auscultation bilaterally, no  wheezing/ronchi/rales.  Comfortable work of breathing. Good air movement. Cardiac: Regular rate and rhythm. Normal S1/S2.  No murmurs, rubs, nor gallops.   Extremities: No peripheral edema.  Strong peripheral pulses.  Mental Status: No depression, anxiety, nor agitation. Skin: Warm and dry. Erythematous clusters of vesicles involving the right L1 dermatome   Assessment & Plan: [name] was seen today for rash.  Diagnoses and all orders for this visit:  Asthma, moderate persistent, uncomplicated -     montelukast (SINGULAIR) 10 MG tablet; Take 1 tablet (10 mg total) by mouth at bedtime.  Generalized anxiety disorder -     clonazePAM (KLONOPIN) 0.5 MG tablet; Take 1 tablet (0.5 mg total) by mouth 2 (two) times daily as needed for anxiety.  Herpes zoster -     valACYclovir (VALTREX) 1000 MG tablet; Take 1 tablet (1,000 mg total) by mouth 3 (three) times daily. For seven days. -     gabapentin (NEURONTIN) 300 MG capsule; One by mouth at bedtime to help reduce shingles pain, may increase to three times a day if tolerated and pain persists.   Asthma: Uncontrolled restart Singulair and return if not improving after 1 week. Otherwise follow-up in a month   anxiety: Control, switching from alprazolam to clonazepam Herpes zoster: Start Valtrex immediately and when necessary gabapentin. He was offered a shot of Depo-Medrol today which he is interested in.   Return in  about 4 weeks (around 11/17/2015) for breathing recheck.

## 2015-10-21 ENCOUNTER — Telehealth: Payer: Self-pay

## 2015-10-21 MED ORDER — PREGABALIN 75 MG PO CAPS: ORAL_CAPSULE | ORAL | Status: DC

## 2015-10-21 NOTE — Telephone Encounter (Signed)
Pt wants to know should he use any creams or lotions on the area with shingles.

## 2015-10-21 NOTE — Telephone Encounter (Signed)
Pt given recommendations. He reports that the gabapentin is not helping with the nerve pain. The pain is now traveling down his leg. Please advise.

## 2015-10-21 NOTE — Telephone Encounter (Signed)
Pt notified. Pt can not come get Rx today so he will take gabapentin today until he's able to get lyrica Rx. Pt voiced understanding.

## 2015-10-21 NOTE — Telephone Encounter (Signed)
[place], Rx placed in in-box ready for pickup/faxing. Stop gabapentin and switch to Lyrica.

## 2015-10-21 NOTE — Telephone Encounter (Signed)
He can try an OTC medication called capsasin, it only works in about 50% of people.  If it does not work just call me and I can call in a Rx for topical lidocaine.

## 2016-02-05 ENCOUNTER — Ambulatory Visit (INDEPENDENT_AMBULATORY_CARE_PROVIDER_SITE_OTHER): Payer: BLUE CROSS/BLUE SHIELD

## 2016-02-05 ENCOUNTER — Encounter: Payer: Self-pay | Admitting: Family Medicine

## 2016-02-05 ENCOUNTER — Ambulatory Visit (INDEPENDENT_AMBULATORY_CARE_PROVIDER_SITE_OTHER): Payer: BLUE CROSS/BLUE SHIELD | Admitting: Family Medicine

## 2016-02-05 VITALS — BP 137/83 | HR 79 | Ht 74.0 in | Wt 319.0 lb

## 2016-02-05 DIAGNOSIS — M25472 Effusion, left ankle: Secondary | ICD-10-CM

## 2016-02-05 DIAGNOSIS — M25572 Pain in left ankle and joints of left foot: Secondary | ICD-10-CM

## 2016-02-05 MED ORDER — COLCHICINE 0.6 MG PO TABS: 0.6000 mg | ORAL_TABLET | Freq: Every day | ORAL | 1 refills | Status: DC

## 2016-02-05 MED ORDER — TESTOSTERONE CYPIONATE 200 MG/ML IM SOLN: 200.0000 mg | INTRAMUSCULAR | 0 refills | Status: DC

## 2016-02-05 MED ORDER — COLCHICINE 0.6 MG PO CAPS: 1.0000 | ORAL_CAPSULE | Freq: Every day | ORAL | 1 refills | Status: DC
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
## 2016-02-05 NOTE — Patient Instructions (Signed)
Thank you for coming in today. Take colchicine daily  Get the cheapest one.  Return as needed.    Gout Gout is an inflammatory arthritis caused by a buildup of uric acid crystals in the joints. Uric acid is a chemical that is normally present in the blood. When the level of uric acid in the blood is too high it can form crystals that deposit in your joints and tissues. This causes joint redness, soreness, and swelling (inflammation). Repeat attacks are common. Over time, uric acid crystals can form into masses (tophi) near a joint, destroying bone and causing disfigurement. Gout is treatable and often preventable. CAUSES  The disease begins with elevated levels of uric acid in the blood. Uric acid is produced by your body when it breaks down a naturally found substance called purines. Certain foods you eat, such as meats and fish, contain high amounts of purines. Causes of an elevated uric acid level include:  Being passed down from parent to child (heredity).  Diseases that cause increased uric acid production (such as obesity, psoriasis, and certain cancers).  Excessive alcohol use.  Diet, especially diets rich in meat and seafood.  Medicines, including certain cancer-fighting medicines (chemotherapy), water pills (diuretics), and aspirin.  Chronic kidney disease. The kidneys are no longer able to remove uric acid well.  Problems with metabolism. Conditions strongly associated with gout include:  Obesity.  High blood pressure.  High cholesterol.  Diabetes. Not everyone with elevated uric acid levels gets gout. It is not understood why some people get gout and others do not. Surgery, joint injury, and eating too much of certain foods are some of the factors that can lead to gout attacks. SYMPTOMS   An attack of gout comes on quickly. It causes intense pain with redness, swelling, and warmth in a joint.  Fever can occur.  Often, only one joint is involved. Certain joints are  more commonly involved:  Base of the big toe.  Knee.  Ankle.  Wrist.  Finger. Without treatment, an attack usually goes away in a few days to weeks. Between attacks, you usually will not have symptoms, which is different from many other forms of arthritis. DIAGNOSIS  Your caregiver will suspect gout based on your symptoms and exam. In some cases, tests may be recommended. The tests may include:  Blood tests.  Urine tests.  X-rays.  Joint fluid exam. This exam requires a needle to remove fluid from the joint (arthrocentesis). Using a microscope, gout is confirmed when uric acid crystals are seen in the joint fluid. TREATMENT  There are two phases to gout treatment: treating the sudden onset (acute) attack and preventing attacks (prophylaxis).  Treatment of an Acute Attack.  Medicines are used. These include anti-inflammatory medicines or steroid medicines.  An injection of steroid medicine into the affected joint is sometimes necessary.  The painful joint is rested. Movement can worsen the arthritis.  You may use warm or cold treatments on painful joints, depending which works best for you.  Treatment to Prevent Attacks.  If you suffer from frequent gout attacks, your caregiver may advise preventive medicine. These medicines are started after the acute attack subsides. These medicines either help your kidneys eliminate uric acid from your body or decrease your uric acid production. You may need to stay on these medicines for a very long time.  The early phase of treatment with preventive medicine can be associated with an increase in acute gout attacks. For this reason, during the first few  months of treatment, your caregiver may also advise you to take medicines usually used for acute gout treatment. Be sure you understand your caregiver's directions. Your caregiver may make several adjustments to your medicine dose before these medicines are effective.  Discuss dietary  treatment with your caregiver or dietitian. Alcohol and drinks high in sugar and fructose and foods such as meat, poultry, and seafood can increase uric acid levels. Your caregiver or dietitian can advise you on drinks and foods that should be limited. HOME CARE INSTRUCTIONS   Do not take aspirin to relieve pain. This raises uric acid levels.  Only take over-the-counter or prescription medicines for pain, discomfort, or fever as directed by your caregiver.  Rest the joint as much as possible. When in bed, keep sheets and blankets off painful areas.  Keep the affected joint raised (elevated).  Apply warm or cold treatments to painful joints. Use of warm or cold treatments depends on which works best for you.  Use crutches if the painful joint is in your leg.  Drink enough fluids to keep your urine clear or pale yellow. This helps your body get rid of uric acid. Limit alcohol, sugary drinks, and fructose drinks.  Follow your dietary instructions. Pay careful attention to the amount of protein you eat. Your daily diet should emphasize fruits, vegetables, whole grains, and fat-free or low-fat milk products. Discuss the use of coffee, vitamin C, and cherries with your caregiver or dietitian. These may be helpful in lowering uric acid levels.  Maintain a healthy body weight. SEEK MEDICAL CARE IF:   You develop diarrhea, vomiting, or any side effects from medicines.  You do not feel better in 24 hours, or you are getting worse. SEEK IMMEDIATE MEDICAL CARE IF:   Your joint becomes suddenly more tender, and you have chills or a fever. MAKE SURE YOU:   Understand these instructions.  Will watch your condition.  Will get help right away if you are not doing well or get worse.   This information is not intended to replace advice given to you by your health care provider. Make sure you discuss any questions you have with your health care provider.   Document Released: 05/21/2000 Document  Revised: 06/14/2014 Document Reviewed: 01/05/2012 Elsevier Interactive Patient Education 2016 Elsevier Inc[employer]   Low-Purine Diet Purines are compounds that affect the level of uric acid in your body. A low-purine diet is a diet that is low in purines. Eating a low-purine diet can prevent the level of uric acid in your body from getting too high and causing gout or kidney stones or both. WHAT DO I NEED TO KNOW ABOUT THIS DIET?  Choose low-purine foods. Examples of low-purine foods are listed in the next section.  Drink plenty of fluids, especially water. Fluids can help remove uric acid from your body. Try to drink 8-16 cups (1.9-3.8 L) a day.  Limit foods high in fat, especially saturated fat, as fat makes it harder for the body to get rid of uric acid. Foods high in saturated fat include pizza, cheese, ice cream, whole milk, fried foods, and gravies. Choose foods that are lower in fat and lean sources of protein. Use olive oil when cooking as it contains healthy fats that are not high in saturated fat.  Limit alcohol. Alcohol interferes with the elimination of uric acid from your body. If you are having a gout attack, avoid all alcohol.  Keep in mind that different people's bodies react differently to different foods.  You will probably learn over time which foods do or do not affect you. If you discover that a food tends to cause your gout to flare up, avoid eating that food. You can more freely enjoy foods that do not cause problems. If you have any questions about a food item, talk to your dietitian or health care provider. WHICH FOODS ARE LOW, MODERATE, AND HIGH IN PURINES? The following is a list of foods that are low, moderate, and high in purines. You can eat any amount of the foods that are low in purines. You may be able to have small amounts of foods that are moderate in purines. Ask your health care provider how much of a food moderate in purines you can have. Avoid foods high in  purines. Grains  Foods low in purines: Enriched white bread, pasta, rice, cake, cornbread, popcorn.  Foods moderate in purines: Whole-grain breads and cereals, wheat germ, bran, oatmeal. Uncooked oatmeal. Dry wheat bran or wheat germ.  Foods high in purines: Pancakes, [place] toast, biscuits, muffins. Vegetables  Foods low in purines: All vegetables, except those that are moderate in purines.  Foods moderate in purines: Asparagus, cauliflower, spinach, mushrooms, green peas. Fruits  All fruits are low in purines. Meats and other Protein Foods  Foods low in purines: Eggs, nuts, peanut butter.  Foods moderate in purines: 80-90% lean beef, lamb, veal, pork, poultry, fish, eggs, peanut butter, nuts. Crab, lobster, oysters, and shrimp. Cooked dried beans, peas, and lentils.  Foods high in purines: Anchovies, sardines, herring, mussels, tuna, codfish, scallops, trout, and haddock. Thomas Salas. Organ meats (such as liver or kidney). Tripe. Game meat. Goose. Sweetbreads. Dairy  All dairy foods are low in purines. Low-fat and fat-free dairy products are best because they are low in saturated fat. Beverages  Drinks low in purines: Water, carbonated beverages, tea, coffee, cocoa.  Drinks moderate in purines: Soft drinks and other drinks sweetened with high-fructose corn syrup. Juices. To find whether a food or drink is sweetened with high-fructose corn syrup, look at the ingredients list.  Drinks high in purines: Alcoholic beverages (such as beer). Condiments  Foods low in purines: Salt, herbs, olives, pickles, relishes, vinegar.  Foods moderate in purines: Butter, margarine, oils, mayonnaise. Fats and Oils  Foods low in purines: All types, except gravies and sauces made with meat.  Foods high in purines: Gravies and sauces made with meat. Other Foods  Foods low in purines: Sugars, sweets, gelatin. Cake. Soups made without meat.  Foods moderate in purines: Meat-based or fish-based soups,  broths, or bouillons. Foods and drinks sweetened with high-fructose corn syrup.  Foods high in purines: High-fat desserts (such as ice cream, cookies, cakes, pies, doughnuts, and chocolate). Contact your dietitian for more information on foods that are not listed here.   This information is not intended to replace advice given to you by your health care provider. Make sure you discuss any questions you have with your health care provider.   Document Released: 09/18/2010 Document Revised: 05/29/2013 Document Reviewed: 04/30/2013 Elsevier Interactive Patient Education [employer]

## 2016-02-06 LAB — COMPREHENSIVE METABOLIC PANEL
ALBUMIN: 4.9 g/dL (ref 3.6–5.1)
ALT: 27 U/L (ref 9–46)
AST: 14 U/L (ref 10–40)
Alkaline Phosphatase: 42 U/L (ref 40–115)
BUN: 14 mg/dL (ref 7–25)
CHLORIDE: 104 mmol/L (ref 98–110)
CO2: 26 mmol/L (ref 20–31)
Calcium: 9.9 mg/dL (ref 8.6–10.3)
Creat: 0.83 mg/dL (ref 0.60–1.35)
Glucose, Bld: 91 mg/dL (ref 65–99)
POTASSIUM: 4.2 mmol/L (ref 3.5–5.3)
Sodium: 142 mmol/L (ref 135–146)
TOTAL PROTEIN: 7.7 g/dL (ref 6.1–8.1)
Total Bilirubin: 0.7 mg/dL (ref 0.2–1.2)

## 2016-02-06 LAB — CBC
HCT: 48.3 % (ref 38.5–50.0)
Hemoglobin: 16.2 g/dL (ref 13.2–17.1)
MCH: 29.9 pg (ref 27.0–33.0)
MCHC: 33.5 g/dL (ref 32.0–36.0)
MCV: 89.1 fL (ref 80.0–100.0)
MPV: 10.4 fL (ref 7.5–12.5)
PLATELETS: 300 10*3/uL (ref 140–400)
RBC: 5.42 MIL/uL (ref 4.20–5.80)
RDW: 13.3 % (ref 11.0–15.0)
WBC: 7.4 10*3/uL (ref 3.8–10.8)

## 2016-02-06 LAB — URIC ACID: Uric Acid, Serum: 9.1 mg/dL — ABNORMAL HIGH (ref 4.0–8.0)

## 2016-06-02 ENCOUNTER — Emergency Department
Admission: EM | Admit: 2016-06-02 | Discharge: 2016-06-02 | Disposition: A | Discharge status: Home / Self Care | Payer: BLUE CROSS/BLUE SHIELD | Source: Home / Self Care

## 2016-06-02 ENCOUNTER — Encounter: Payer: Self-pay | Admitting: Emergency Medicine

## 2016-06-02 NOTE — ED Triage Notes (Signed)
Pt c/o dry cough, post nasal drip and sore throat x1 week. Afebrile and not taking any meds/

## 2016-06-02 NOTE — ED Notes (Signed)
Pt left without being seen.

## 2016-06-03 ENCOUNTER — Emergency Department (INDEPENDENT_AMBULATORY_CARE_PROVIDER_SITE_OTHER)
Admission: EM | Admit: 2016-06-03 | Discharge: 2016-06-03 | Disposition: A | Discharge status: Home / Self Care | Payer: BLUE CROSS/BLUE SHIELD | Source: Home / Self Care | Attending: Family Medicine | Admitting: Family Medicine

## 2016-06-03 DIAGNOSIS — J9801 Acute bronchospasm: Secondary | ICD-10-CM

## 2016-06-03 DIAGNOSIS — B9789 Other viral agents as the cause of diseases classified elsewhere: Secondary | ICD-10-CM

## 2016-06-03 DIAGNOSIS — J069 Acute upper respiratory infection, unspecified: Secondary | ICD-10-CM | POA: Diagnosis not present

## 2016-06-03 MED ORDER — PREDNISONE 20 MG PO TABS
ORAL_TABLET | ORAL | 0 refills | Status: DC
Start: 2016-06-03 — End: 2017-11-22

## 2016-06-03 MED ORDER — AZITHROMYCIN 250 MG PO TABS: ORAL_TABLET | ORAL | 0 refills | Status: DC

## 2016-06-03 MED ORDER — BENZONATATE 200 MG PO CAPS: ORAL_CAPSULE | ORAL | 0 refills | Status: DC

## 2016-06-03 NOTE — Discharge Instructions (Signed)
Take plain guaifenesin (1200mg  extended release tabs such as Mucinex) twice daily, with plenty of water, for cough and congestion.  May add Pseudoephedrine (30mg , one or two every 4 to 6 hours) for sinus congestion.  Get adequate rest.   May use Afrin nasal spray (or generic oxymetazoline) twice daily for about 5 days and then discontinue.  Also recommend using saline nasal spray several times daily and saline nasal irrigation (AYR is a common brand).  Use Flonase nasal spray each morning after using Afrin nasal spray and saline nasal irrigation. Try warm salt water gargles for sore throat.  Stop all antihistamines for now, and other non-prescription cough/cold preparations. Continue inhalers as prescribed.

## 2016-06-03 NOTE — ED Provider Notes (Signed)
[facility]    CSN: [id number] Arrival date & time: 06/03/16  1129     History   Chief Complaint Chief Complaint  Patient presents with  . Cough    HPI Thomas Salas is a 20 y.o. male.   About 2 weeks ago patient developed typical cold-like symptoms developing over several days, including mild sore throat, sinus congestion, fatigue, and cough.  His cough persists, and he has developed wheezing requiring increased use of his albuterol inhaler.   The history is provided by the patient.    Past Medical History:  Diagnosis Date  . Unspecified asthma(493.90) 12/13/2012   Has been Rxed controlled inhaler but not using it.     Patient Active Problem List   Diagnosis Date Noted  . Generalized anxiety disorder 04/14/2015  . Asthma, moderate persistent 04/16/2013  . Asthma 12/13/2012    No past surgical history on file.     Home Medications    Prior to Admission medications   Medication Sig Start Date End Date Taking? Authorizing Provider  ALPRAZolam Prudy Feeler) 0.5 MG tablet Take 1 tablet (0.5 mg total) by mouth 2 (two) times daily as needed for anxiety. 05/26/15   Sean Hommel, DO  azithromycin (ZITHROMAX Z-PAK) 250 MG tablet Take 2 tabs today; then begin one tab once daily for 4 more days. 06/03/16   Lattie Haw, MD  benzonatate (TESSALON) 200 MG capsule Take one cap by mouth at bedtime as needed for cough.  May repeat in 4 to 6 hours 06/03/16   Lattie Haw, MD  Colchicine 0.6 MG CAPS Take 1 capsule by mouth daily. 02/05/16   Rodolph Bong, MD  colchicine 0.6 MG tablet Take 1 tablet (0.6 mg total) by mouth daily. 02/05/16   Rodolph Bong, MD  Fluticasone Furoate-Vilanterol 100-25 MCG/INH AEPB One inhalation daily to prevent wheezing, cough, or shortness of breath. 04/14/15   Sean Hommel, DO  montelukast (SINGULAIR) 10 MG tablet Take 1 tablet (10 mg total) by mouth at bedtime. 10/20/15   Sean Hommel, DO  predniSONE (DELTASONE) 20 MG tablet Take one tab by mouth  twice daily for 5 days, then one daily for 3 days. Take with food. 06/03/16   Lattie Haw, MD  VENTOLIN HFA 108 (90 Base) MCG/ACT inhaler INHALE TWO PUFFS INTO LUNGS EVERY 4 HOURS AS NEEDED FOR WHEEZING OR SHORTNESS OF BREATH 09/08/15   Laren Boom, DO    Family History No family history on file.  Social History Social History  Substance Use Topics  . Smoking status: Former Smoker    Packs/day: 0.50    Types: Cigarettes  . Smokeless tobacco: Never Used  . Alcohol use Yes     Allergies   Patient has no known allergies.   Review of Systems Review of Systems  + sore throat + cough No pleuritic pain + wheezing + nasal congestion + post-nasal drainage No sinus pain/pressure No itchy/red eyes No earache No hemoptysis + SOB No fever, ? chills No nausea No vomiting No abdominal pain No diarrhea No urinary symptoms No skin rash + fatigue No myalgias No headache Used OTC meds without relief    Physical Exam Triage Vital Signs ED Triage Vitals [06/03/16 1142]  Enc Vitals Group     BP 146/94     Pulse Rate 81     Resp 16     Temp 97.7 F (36.5 C)     Temp Source Oral     SpO2 97 %  Weight      Height      Head Circumference      Peak Flow      Pain Score 0     Pain Loc      Pain Edu?      Excl. in GC?    No data found.   Updated Vital Signs BP 146/94 (BP Location: Left Arm)   Pulse 81   Temp 97.7 F (36.5 C) (Oral)   Resp 16   SpO2 97%   Visual Acuity Right Eye Distance:   Left Eye Distance:   Bilateral Distance:    Right Eye Near:   Left Eye Near:    Bilateral Near:     Physical Exam Nursing notes and Vital Signs reviewed. Appearance:  Patient appears stated age, and in no acute distress Eyes:  Pupils are equal, round, and reactive to light and accomodation.  Extraocular movement is intact.  Conjunctivae are not inflamed  Ears:  Canals normal.  Tympanic membranes normal.  Nose:  Mildly congested turbinates.  No sinus tenderness.     Pharynx:  Uvula slightly edematous. Neck:  Supple.  Tender enlarged posterior/lateral nodes are palpated bilaterally  Lungs:   Scattered faint expiratory wheezes posteriorly.  Breath sounds are equal.  Moving air well. Heart:  Regular rate and rhythm without murmurs, rubs, or gallops.  Abdomen:  Nontender without masses or hepatosplenomegaly.  Bowel sounds are present.  No CVA or flank tenderness.  Extremities:  No edema.  Skin:  No rash present.    UC Treatments / Results  Labs (all labs ordered are listed, but only abnormal results are displayed) Labs Reviewed - No data to display  EKG  EKG Interpretation None       Radiology No results found.  Procedures Procedures (including critical care time)  Medications Ordered in UC Medications - No data to display   Initial Impression / Assessment and Plan / UC Course  I have reviewed the triage vital signs and the nursing notes.  Pertinent labs & imaging results that were available during my care of the patient were reviewed by me and considered in my medical decision making (see chart for details).  Clinical Course   Begin prednisone burst/taper, and Z-pak for atypical coverage. Prescription written for Benzonatate [facility](Tessalon) to take at bedtime for night-time cough.  Take plain guaifenesin (1200mg  extended release tabs such as Mucinex) twice daily, with plenty of water, for cough and congestion.  May add Pseudoephedrine (30mg , one or two every 4 to 6 hours) for sinus congestion.  Get adequate rest.   May use Afrin nasal spray (or generic oxymetazoline) twice daily for about 5 days and then discontinue.  Also recommend using saline nasal spray several times daily and saline nasal irrigation (AYR is a common brand).  Use Flonase nasal spray each morning after using Afrin nasal spray and saline nasal irrigation. Try warm salt water gargles for sore throat.  Stop all antihistamines for now, and other non-prescription cough/cold  preparations. Continue inhalers as prescribed. Followup with Family Doctor if not improved in one week.      Final Clinical Impressions(s) / UC Diagnoses   Final diagnoses:  Viral URI with cough  Bronchospasm    New Prescriptions New Prescriptions   AZITHROMYCIN (ZITHROMAX Z-PAK) 250 MG TABLET    Take 2 tabs today; then begin one tab once daily for 4 more days.   BENZONATATE (TESSALON) 200 MG CAPSULE    Take one cap by mouth at  bedtime as needed for cough.  May repeat in 4 to 6 hours   PREDNISONE (DELTASONE) 20 MG TABLET    Take one tab by mouth twice daily for 5 days, then one daily for 3 days. Take with food.     Lattie HawStephen A Beese, MD 06/20/16 [phone]

## 2016-07-02 ENCOUNTER — Other Ambulatory Visit: Payer: Self-pay

## 2016-07-02 MED ORDER — ALBUTEROL SULFATE HFA 108 (90 BASE) MCG/ACT IN AERS: INHALATION_SPRAY | RESPIRATORY_TRACT | 1 refills | Status: AC

## 2017-11-22 ENCOUNTER — Ambulatory Visit (INDEPENDENT_AMBULATORY_CARE_PROVIDER_SITE_OTHER): Payer: Self-pay | Admitting: Sports Medicine

## 2017-11-22 ENCOUNTER — Encounter: Payer: Self-pay | Admitting: Sports Medicine

## 2017-11-22 DIAGNOSIS — M109 Gout, unspecified: Secondary | ICD-10-CM | POA: Insufficient documentation

## 2017-11-22 DIAGNOSIS — M10071 Idiopathic gout, right ankle and foot: Secondary | ICD-10-CM

## 2017-11-22 MED ORDER — COLCHICINE 0.6 MG PO TABS: 0.6000 mg | ORAL_TABLET | Freq: Two times a day (BID) | ORAL | 2 refills | Status: DC

## 2017-11-22 MED ORDER — ALLOPURINOL 300 MG PO TABS: 300.0000 mg | ORAL_TABLET | Freq: Every day | ORAL | 3 refills | Status: DC

## 2017-11-22 NOTE — Progress Notes (Signed)
Subjective:    I'm seeing this patient as a consultation for: Dr. SeLaren Booman Hommel  CC: Right foot swelling  HPI: This is a pleasant 22 year old male, for the past couple of days he has had worsening severe swelling of his right foot, dorsal midfoot.  Severe pain.  Redness, hotness.  No fevers or chills.  He did have an episode of this a couple of years back in a few times since.  Uric acid levels have been tested and were very high.  Symptoms are severe, persistent, localized without radiation, no change in diet.  I reviewed the past medical history, family history, social history, surgical history, and allergies today and no changes were needed.  Please see the problem list section below in epic for further details.  Past Medical History: Past Medical History:  Diagnosis Date  . Unspecified asthma(493.90) 12/13/2012   Has been Rxed controlled inhaler but not using it.    Past Surgical History: No past surgical history on file. Social History: Social History   Socioeconomic History  . Marital status: Single    Spouse name: Not on file  . Number of children: Not on file  . Years of education: Not on file  . Highest education level: Not on file  Occupational History  . Not on file  Social Needs  . Financial resource strain: Not on file  . Food insecurity:    Worry: Not on file    Inability: Not on file  . Transportation needs:    Medical: Not on file    Non-medical: Not on file  Tobacco Use  . Smoking status: Former Smoker    Packs/day: 0.50    Types: Cigarettes  . Smokeless tobacco: Never Used  Substance and Sexual Activity  . Alcohol use: Yes  . Drug use: No  . Sexual activity: Not Currently  Lifestyle  . Physical activity:    Days per week: Not on file    Minutes per session: Not on file  . Stress: Not on file  Relationships  . Social connections:    Talks on phone: Not on file    Gets together: Not on file    Attends religious service: Not on file    Active  member of club or organization: Not on file    Attends meetings of clubs or organizations: Not on file    Relationship status: Not on file  Other Topics Concern  . Not on file  Social History Narrative  . Not on file   Family History: No family history on file. Allergies: No Known Allergies Medications: See med rec.  Review of Systems: No headache, visual changes, nausea, vomiting, diarrhea, constipation, dizziness, abdominal pain, skin rash, fevers, chills, night sweats, weight loss, swollen lymph nodes, body aches, joint swelling, muscle aches, chest pain, shortness of breath, mood changes, visual or auditory hallucinations.   Objective:   General: Well Developed, well nourished, and in no acute distress.  Neuro:  Extra-ocular muscles intact, able to move all 4 extremities, sensation grossly intact.  Deep tendon reflexes tested were normal. Psych: Alert and oriented, mood congruent with affect. ENT:  Ears and nose appear unremarkable.  Hearing grossly normal. Neck: Unremarkable overall appearance, trachea midline.  No visible thyroid enlargement. Eyes: Conjunctivae and lids appear unremarkable.  Pupils equal and round. Skin: Warm and dry, no rashes noted.  Cardiovascular: Pulses palpable, no extremity edema. Right Foot: Fusiform swelling over the dorsal midfoot. Range of motion is full in all directions. Strength is  5/5 in all directions. No hallux valgus. No pes cavus or pes planus. No abnormal callus noted. No pain over the navicular prominence, or base of fifth metatarsal. No tenderness to palpation of the calcaneal insertion of plantar fascia. No pain at the Achilles insertion. No pain over the calcaneal bursa. No pain of the retrocalcaneal bursa. No tenderness to palpation over the tarsals, metatarsals, or phalanges. No hallux rigidus or limitus. No tenderness palpation over interphalangeal joints. No pain with compression of the metatarsal heads. Neurovascularly  intact distally.  Procedure: Real-time Ultrasound Guided aspiration/injection of right talonavicular joint Device: GE Logiq E  Verbal informed consent obtained.  Time-out conducted.  Noted no overlying erythema, induration, or other signs of local infection.  Skin prepped in a sterile fashion.  Local anesthesia: Topical Ethyl chloride.  With sterile technique and under real time ultrasound guidance: Noted talonavicular effusion on ultrasound, I advanced a 22-gauge needle into the talonavicular joint effusion, aspirated 1.4 cc of cloudy, straw-colored fluid, syringe switched and 1 cc kenalog 40, 1 cc lidocaine injected easily. Completed without difficulty  Pain immediately resolved suggesting accurate placement of the medication.  Advised to call if fevers/chills, erythema, induration, drainage, or persistent bleeding.  Images permanently stored and available for review in the ultrasound unit.  Impression: Technically successful ultrasound guided injection.  The foot was then strapped with a compressive dressing.  Impression and Recommendations:   This case required medical decision making of moderate complexity.  Gout of right foot History of uric acid levels over 9. Has not been on any medications. 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 Injected. Starting allopurinol, colchicine. Return in 1 month to recheck uric acid levels, goal is less than 5. ___________________________________________ Ihor Austin. Benjamin Stain, M.D., ABFM., CAQSM. Primary Care and Sports Medicine [facility]  Adjunct Instructor of Family Medicine  [employer]

## 2017-11-22 NOTE — Assessment & Plan Note (Signed)
History of uric acid levels over 9. Has not been on any medications. 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 Injected. Starting allopurinol, colchicine. Return in 1 month to recheck uric acid levels, goal is less than 5.

## 2017-11-22 NOTE — Patient Instructions (Signed)

## 2017-11-23 LAB — SYNOVIAL FLUID, CRYSTAL

## 2018-04-09 IMAGING — DX DG ANKLE COMPLETE 3+V*L*
3 series · 3 of 3 positions shown · non-contrast
Comparison: None

CLINICAL DATA: Awoke 2-3 days ago with LEFT lateral ankle swelling
and pain, no known injury

EXAM:
LEFT ANKLE COMPLETE - 3+ VIEW

[ankle ap]
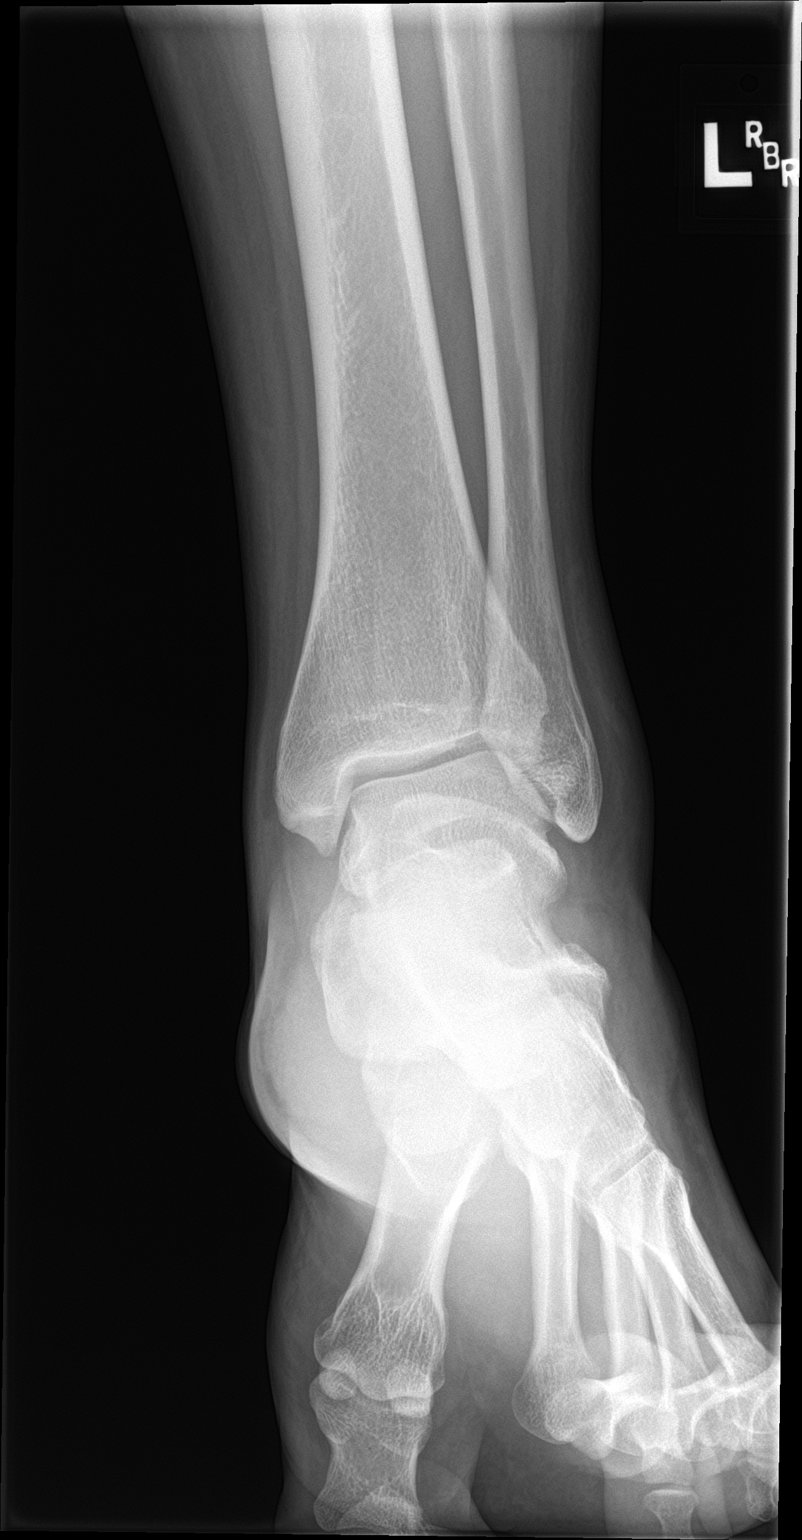

[ankle obl]
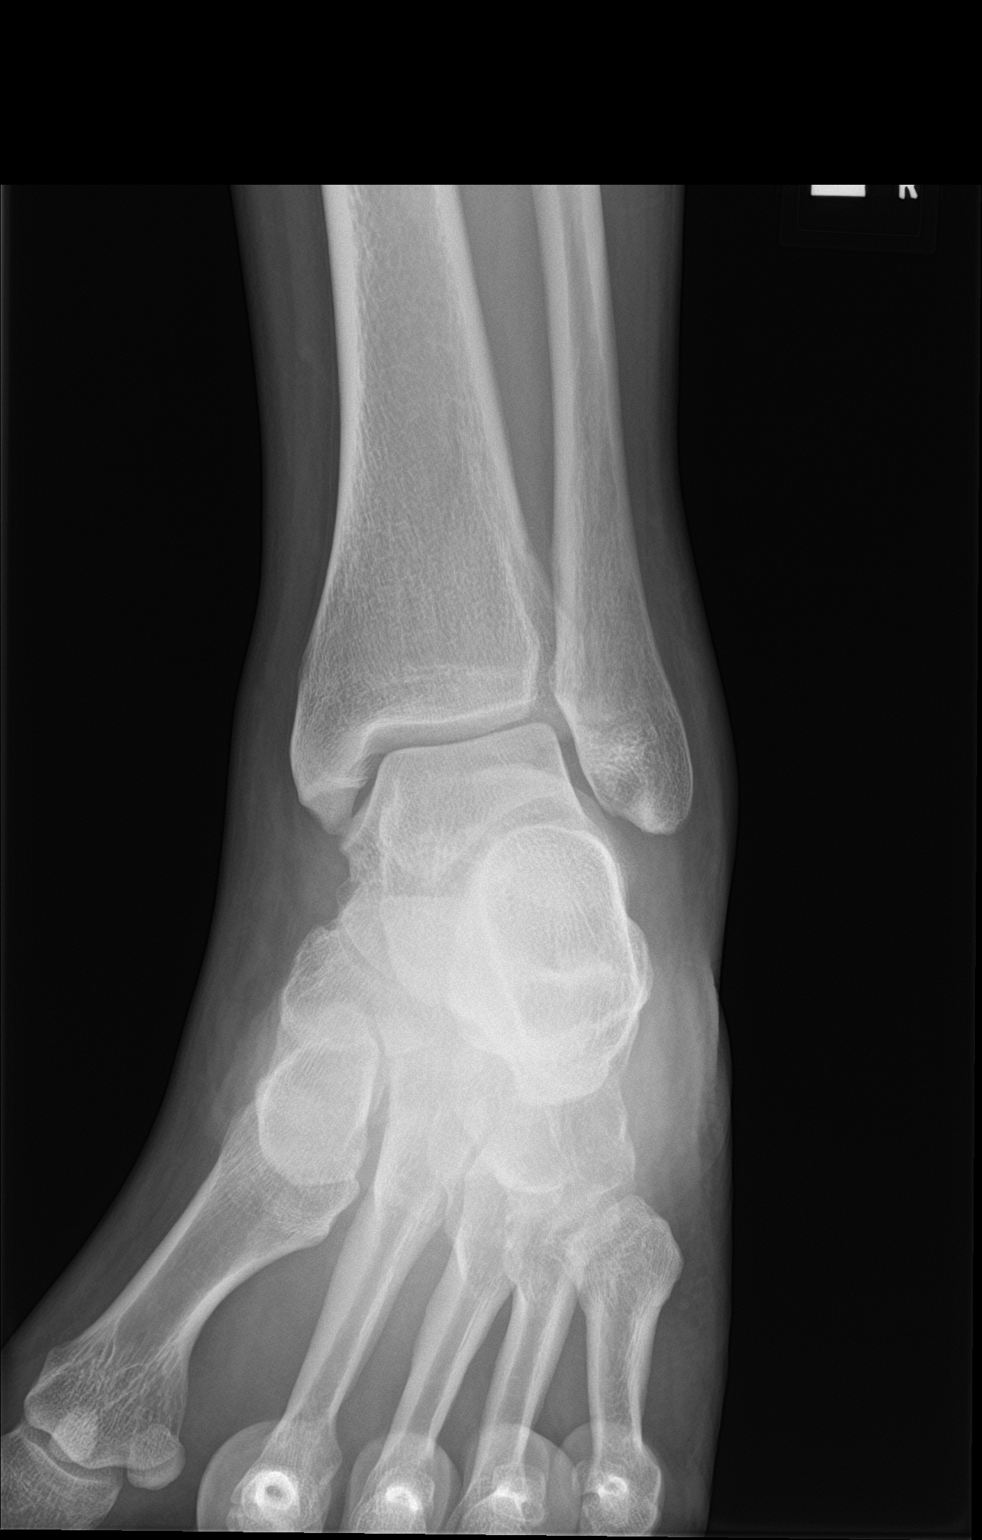

[ankle lat]
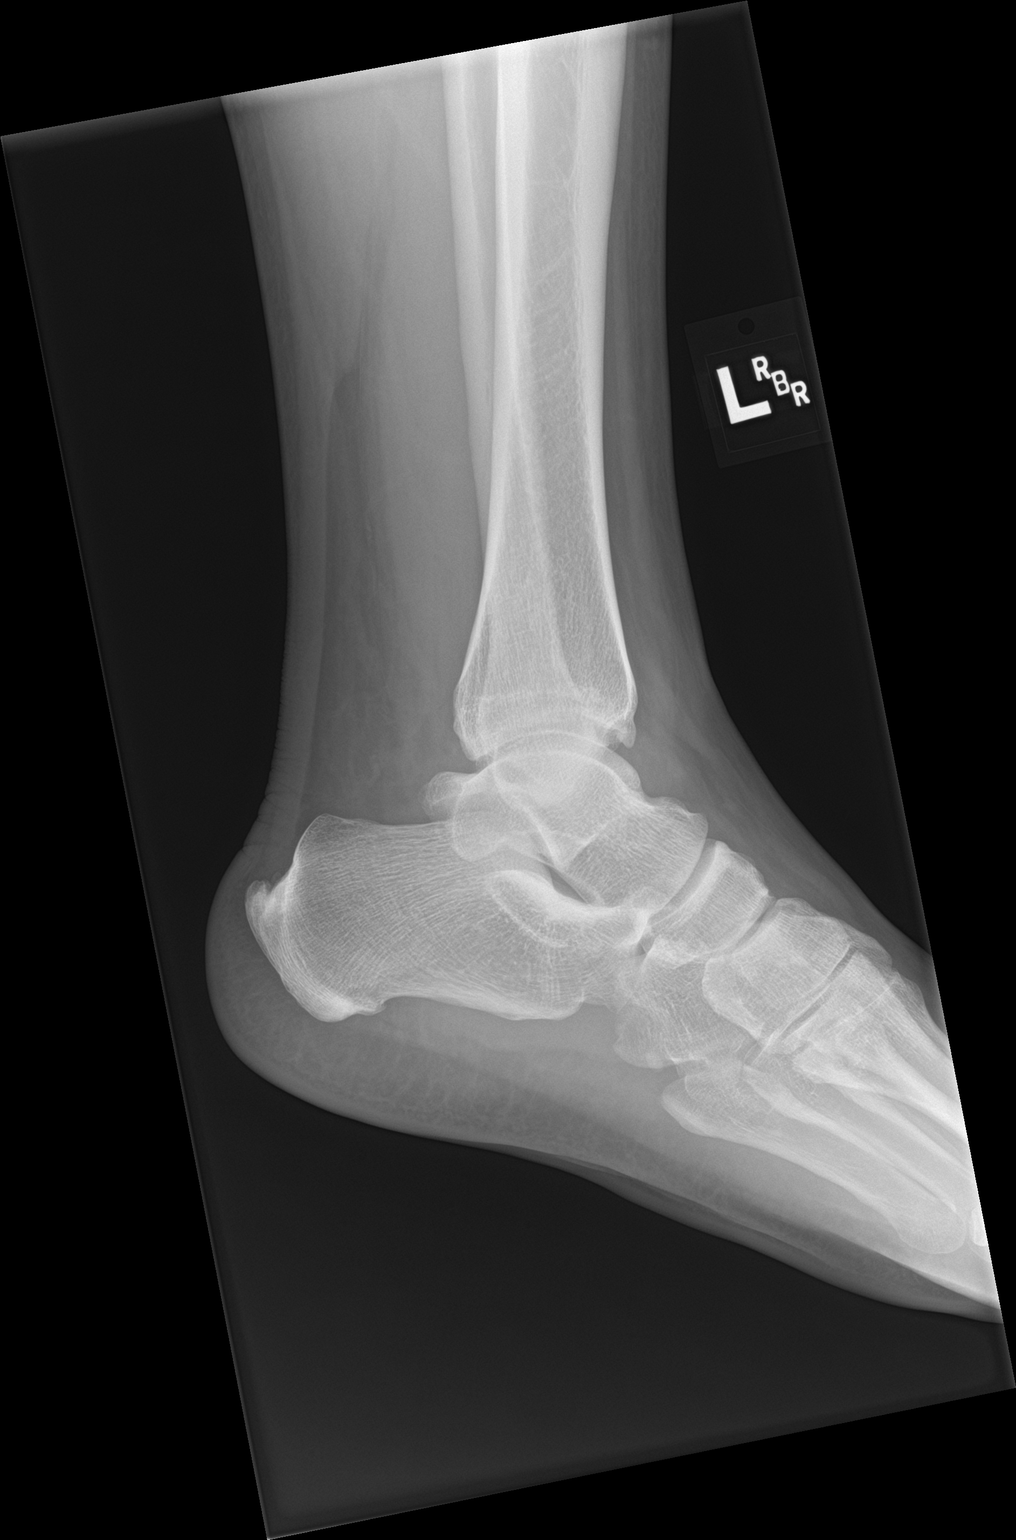

[3 of 3 positions shown; findings below may reference images not displayed]

FINDINGS: Lateral soft tissue swelling.

Osseous mineralization normal.

Ankle mortise intact.

No acute fracture, dislocation, or bone destruction.

Achilles insertion calcaneal spur formation.
IMPRESSION: No acute osseous abnormalities.

## 2018-05-21 ENCOUNTER — Other Ambulatory Visit: Payer: Self-pay | Admitting: Sports Medicine

## 2018-05-21 DIAGNOSIS — M10071 Idiopathic gout, right ankle and foot: Secondary | ICD-10-CM

## 2018-08-10 ENCOUNTER — Other Ambulatory Visit: Payer: Self-pay | Admitting: Sports Medicine

## 2018-08-10 DIAGNOSIS — M10071 Idiopathic gout, right ankle and foot: Secondary | ICD-10-CM

## 2019-02-05 ENCOUNTER — Other Ambulatory Visit: Payer: Self-pay | Admitting: Sports Medicine

## 2019-02-05 DIAGNOSIS — M10071 Idiopathic gout, right ankle and foot: Secondary | ICD-10-CM
